# Patient Record
Sex: Female | Born: 1985 | Race: White | Hispanic: No | Marital: Single | Smoking: Current every day smoker
Health system: Southern US, Community
[De-identification: ages and names within clinical notes are randomized; demographics above are authoritative.]

## PROBLEM LIST (undated history)

## (undated) DIAGNOSIS — F319 Bipolar disorder, unspecified: Secondary | ICD-10-CM

## (undated) DIAGNOSIS — IMO0002 Reserved for concepts with insufficient information to code with codable children: Secondary | ICD-10-CM

## (undated) DIAGNOSIS — R51 Headache: Secondary | ICD-10-CM

## (undated) DIAGNOSIS — F32A Depression, unspecified: Secondary | ICD-10-CM

## (undated) DIAGNOSIS — F329 Major depressive disorder, single episode, unspecified: Secondary | ICD-10-CM

## (undated) HISTORY — PX: COLPOSCOPY W/ BIOPSY / CURETTAGE: SUR283

## (undated) HISTORY — PX: OTHER SURGICAL HISTORY: SHX169

## (undated) HISTORY — PX: GALLBLADDER SURGERY: SHX652

## (undated) HISTORY — PX: TONSILLECTOMY: SUR1361

## (undated) HISTORY — PX: ADENOIDECTOMY: SUR15

## (undated) HISTORY — PX: BREAST SURGERY: SHX581

---
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
## 2007-05-12 ENCOUNTER — Inpatient Hospital Stay (HOSPITAL_COMMUNITY): Admission: AD | Admit: 2007-05-12 | Discharge: 2007-05-12 | Payer: Self-pay | Admitting: Obstetrics & Gynecology

## 2007-09-14 ENCOUNTER — Inpatient Hospital Stay (HOSPITAL_COMMUNITY): Admission: AD | Admit: 2007-09-14 | Discharge: 2007-09-15 | Payer: Self-pay | Admitting: Obstetrics and Gynecology

## 2007-09-17 ENCOUNTER — Inpatient Hospital Stay (HOSPITAL_COMMUNITY): Admission: AD | Admit: 2007-09-17 | Discharge: 2007-09-17 | Payer: Self-pay | Admitting: Obstetrics and Gynecology

## 2007-09-18 ENCOUNTER — Inpatient Hospital Stay (HOSPITAL_COMMUNITY): Admission: AD | Admit: 2007-09-18 | Discharge: 2007-09-18 | Payer: Self-pay | Admitting: Obstetrics and Gynecology

## 2007-09-19 ENCOUNTER — Inpatient Hospital Stay (HOSPITAL_COMMUNITY): Admission: AD | Admit: 2007-09-19 | Discharge: 2007-09-21 | Payer: Self-pay | Admitting: Obstetrics and Gynecology
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
## 2010-11-18 NOTE — Op Note (Signed)
[name], [name]                          ACCOUNT NO.:  [id number]   MEDICAL RECORD NO.:  [id number]                   PATIENT TYPE:  AMB   LOCATION:  DAY                                  FACILITY:  [facility]   PHYSICIAN:  Abigail Miyamoto, M.D.              DATE OF BIRTH:  [birth year]   DATE OF PROCEDURE:  07/02/2003  DATE OF DISCHARGE:                                 OPERATIVE REPORT   PREOPERATIVE DIAGNOSIS:  Symptomatic cholelithiasis.   POSTOPERATIVE DIAGNOSIS:  Symptomatic cholelithiasis.   PROCEDURE:  Laparoscopic cholecystectomy with intraoperative cholangiogram.   SURGEON:  Abigail Miyamoto, M.D.   ANESTHESIA:  General endotracheal anesthesia, 0.25% Marcaine.   ESTIMATED BLOOD LOSS:  Minimal.   FINDINGS:  The patient was found to have a thickened gallbladder wall and  multiple gallstones in the gallbladder.  Cholangiogram was normal.   PROCEDURE IN DETAIL:  The patient was brought to the operating room and  identified as [employer].  She was placed supine on the operating table,  and general anesthesia was induced.  Her abdomen was then prepped and draped  in the usual sterile fashion.  Using a #15 blade, a small transverse  incision was made below the umbilicus.  The incision was carried down  through the fascia, which was then opened with a scalpel.  The hemostat was  then used to pass into the peritoneal cavity.  Next, a 0 running pursestring  suture was placed around the fascial opening.  The Hasson cannula was placed  through the opening.  Insufflation of the abdomen was begun.  Next, a 12 mm  port was placed in the patient's epigastrium.  Two 5 mm ports were placed  through the patient's right flank under direct vision.  The gallbladder was  then grasped and retracted above the liver bed.  Dissection was then carried  out at the base of the gallbladder.  The cystic duct was dissected down and  clipped once distally.  It was then partly opened with  laparoscopic  scissors.  An angiocatheter was then inserted in the right upper quadrant  under direct vision.  The cholangiocath was passed through this and placed  into the opening in the cystic duct.  A cholangiogram was then performed  with contrast under direct fluoroscopy.  Contrast was seen to flow into the  entire biliary system and duodenum without evidence of abnormality or  obstructing stone.  The cholangiocath was then completely removed.  The  cystic duct was then clipped three times proximally and transected with  scissors.  The cystic artery and the posterior branch were identified and  clipped, twice proximally and once distally and transected with scissors as  well.  The gallbladder was then slowly dissected free on the liver bed with  the electrocautery.  The gallbladder was opened slightly, and several stones  spilled out.  These were grasped with the stone grasper.  Once  the  gallbladder was removed from the liver bed, it was placed in an endo sac and  removed through the incision at the umbilicus.  A 0 Vicryl was tied at the  umbilicus posterior the fascial defect.  The abdomen was then copiously  irrigated with normal saline.  All free stones were easily scooped up and  suctioned out.  Hemostasis was felt to be achieved.  All ports were removed  under direct vision, and the abdomen was deflated.  All incisions were then  anesthetized with 0.25%  Marcaine and closed with 4-0 Monocryl subcuticular sutures.  Steri-Strips,  gauze, and tape were then applied.  The patient tolerated the procedure  well.  All sponge, needle, and instrument counts were correct at the end of  the procedure.  The patient was then extubated in the operating room and  returned to the recovery room.                                               Abigail Miyamoto, M.D.    DB/MEDQ  D:  07/02/2003  T:  07/02/2003  Job:  [id number]

## 2010-11-18 NOTE — Discharge Summary (Signed)
[facility]  Patient:    [name], [name] Visit Number: [id number] MRN: [id number]          Service Type: PSY Location: 100 0101 01 Attending Physician:  Veneta Penton. Dictated by:   Veneta Penton, M.D. Admit Date:  01/21/2002 Discharge Date: 01/23/2002                             Discharge Summary  REASON FOR ADMISSION:  This 25 year old [name] female was admitted complaining of depression with suicidal ideation with a plan she refused to discuss.  For further history of present illness, please see the patients psychiatric admission assessment.  PHYSICAL EXAMINATION:  At the time of admission was significant for obesity, a history of migraine headaches, and dysmenorrhea.  She had small cyst on her right earlobe.  She had an otherwise unremarkable physical examination.  LABORATORY EXAMINATION:  The patient underwent a laboratory work-up to rule out any other medical problems contributing to her symptomatology.  A urine probe for gonorrhea and chlamydia were negative.  TSH and free T4 were within normal limits.  A GGT was within normal limits.  Hepatic panel was within normal limits.  Basic metabolic panel was within normal limits.  A CBC showed a Broerman count of 4.7 thousand, MCHC of 34.6 and was otherwise unremarkable.  A urine drug screen was negative.  Urine pregnancy test was negative.  An RPR was nonreactive.  The patient received no x-rays, no special procedures, no additional consultations.  She sustained no complications during the course of this hospitalization.  HOSPITAL COURSE:  On admission, the patient was psychomotor agitated, showed poor impulse control, decreased concentration.  Her affect and mood were depressed, irritable, and anxious.  She was begun on a trial of Effexor XR and has tolerated this medication well without side effects.  She has been actively participating in all aspects of the therapeutic treatment program  and over the course of this hospitalization has denied any homicidal or suicidal ideation.  Her affect and mood have improved.  She no longer appears to be a danger to herself or others and consequently is felt to have reached her maximum benefits of hospitalization and is ready for discharge to a less restricted alternative setting.  CONDITION ON DISCHARGE:  Improved  FINAL DIAGNOSIS: Axis I:    1. Major depression, single episode, severe, without psychosis.            2. Oppositional-defiant disorder.            3. Rule out conduct disorder.            4. Alcohol abuse.            5. Cannabis abuse.            6. Rule out polysubstance abuse or dependence. Axis II:   1. Rule out personality disorder not otherwise specified.            2. Rule out learning disorder not otherwise specified. Axis III:  Dysmenorrhea, migraine headaches, obesity. Axis IV:   Current psychosocial stressors are severe. Axis V:    Code 20 on admission, code 30 on discharge.  FURTHER EVALUATION AND TREATMENT RECOMMENDATIONS: 1. The patient is discharged to home. 2. She is discharged on an unrestricted level of activity and a regular diet. 3. She will follow up with Dr. Len Blalock, her outpatient psychiatrist, for    all further aspects  of her psychiatric care and consequently I will sign    off on the case at this time.  She will follow up with her her primary care    physician for all further aspects of her medical care.  DISCHARGE MEDICATIONS:  Effexor XR 37.5 mg p.o. q.a.m. with food for 5 days, then increasing to 75 mg p.o. q.a.m. with food. Dictated by:   Veneta Penton, M.D. Attending Physician:  Veneta Penton DD:  01/23/02 TD:  01/25/02 Job: [id number]

## 2011-03-27 LAB — URINALYSIS, ROUTINE W REFLEX MICROSCOPIC
Bilirubin Urine: NEGATIVE
Glucose, UA: NEGATIVE
Ketones, ur: 15 — AB
Leukocytes, UA: NEGATIVE
Nitrite: NEGATIVE
Protein, ur: NEGATIVE
Specific Gravity, Urine: 1.025
Urobilinogen, UA: 0.2
pH: 6.5

## 2011-03-27 LAB — CCBB MATERNAL DONOR DRAW

## 2011-03-27 LAB — URIC ACID: Uric Acid, Serum: 6

## 2011-03-27 LAB — CBC
HCT: 36.2
HCT: 37.1
Hemoglobin: 10.9 — ABNORMAL LOW
Hemoglobin: 12.5
Hemoglobin: 13
MCHC: 34.5
MCHC: 34.6
MCV: 85.8
MCV: 86.4
Platelets: 190
Platelets: 210
RBC: 3.65 — ABNORMAL LOW
RDW: 12.7
RDW: 13
WBC: 16.2 — ABNORMAL HIGH
WBC: 18.1 — ABNORMAL HIGH

## 2011-03-27 LAB — URINE MICROSCOPIC-ADD ON

## 2011-03-27 LAB — COMPREHENSIVE METABOLIC PANEL
Albumin: 2.7 — ABNORMAL LOW
BUN: 6
Creatinine, Ser: 0.54
Glucose, Bld: 81
Total Protein: 6.2

## 2011-03-27 LAB — LACTATE DEHYDROGENASE: LDH: 145

## 2011-04-11 LAB — URINALYSIS, ROUTINE W REFLEX MICROSCOPIC
Glucose, UA: NEGATIVE
Ketones, ur: NEGATIVE
Nitrite: NEGATIVE
Specific Gravity, Urine: 1.015
pH: 7

## 2012-07-22 ENCOUNTER — Inpatient Hospital Stay (HOSPITAL_COMMUNITY): Payer: 59

## 2012-07-22 ENCOUNTER — Inpatient Hospital Stay (HOSPITAL_COMMUNITY)
Admission: AD | Admit: 2012-07-22 | Discharge: 2012-07-22 | Disposition: A | Discharge status: Hospice | Payer: 59 | Source: Ambulatory Visit | Attending: Obstetrics & Gynecology | Admitting: Obstetrics & Gynecology

## 2012-07-22 ENCOUNTER — Encounter (HOSPITAL_COMMUNITY): Payer: Self-pay | Admitting: *Deleted

## 2012-07-22 DIAGNOSIS — N730 Acute parametritis and pelvic cellulitis: Secondary | ICD-10-CM | POA: Insufficient documentation

## 2012-07-22 DIAGNOSIS — N949 Unspecified condition associated with female genital organs and menstrual cycle: Secondary | ICD-10-CM | POA: Insufficient documentation

## 2012-07-22 DIAGNOSIS — Z30432 Encounter for removal of intrauterine contraceptive device: Secondary | ICD-10-CM

## 2012-07-22 HISTORY — DX: Headache: R51

## 2012-07-22 HISTORY — DX: Bipolar disorder, unspecified: F31.9

## 2012-07-22 HISTORY — DX: Depression, unspecified: F32.A

## 2012-07-22 HISTORY — DX: Reserved for concepts with insufficient information to code with codable children: IMO0002

## 2012-07-22 HISTORY — DX: Major depressive disorder, single episode, unspecified: F32.9

## 2012-07-22 LAB — CBC
HCT: 38.1 % (ref 36.0–46.0)
Hemoglobin: 12.9 g/dL (ref 12.0–15.0)
MCH: 29.6 pg (ref 26.0–34.0)
MCHC: 33.9 g/dL (ref 30.0–36.0)
MCV: 87.4 fL (ref 78.0–100.0)
Platelets: 162 K/uL (ref 150–400)
RBC: 4.36 MIL/uL (ref 3.87–5.11)
RDW: 12.1 % (ref 11.5–15.5)
WBC: 7.2 K/uL (ref 4.0–10.5)

## 2012-07-22 LAB — POCT PREGNANCY, URINE: Preg Test, Ur: NEGATIVE

## 2012-07-22 MED ORDER — OXYCODONE-ACETAMINOPHEN 5-325 MG PO TABS
2.0000 | ORAL_TABLET | ORAL | Status: AC
  Administered 2012-07-22: 2 via ORAL

## 2012-07-22 MED ORDER — CEFTRIAXONE SODIUM 250 MG IJ SOLR
250.0000 mg | INTRAMUSCULAR | Status: AC
  Administered 2012-07-22: 250 mg via INTRAMUSCULAR

## 2012-07-22 MED ORDER — CEFTRIAXONE SODIUM 250 MG IJ SOLR
INTRAMUSCULAR | Status: AC
  Administered 2012-07-22: 250 mg via INTRAMUSCULAR
  Filled 2012-07-22: qty 250

## 2012-07-22 MED ORDER — OXYCODONE-ACETAMINOPHEN 5-325 MG PO TABS
ORAL_TABLET | ORAL | Status: AC
Start: 2012-07-22 — End: 2012-07-22
  Administered 2012-07-22: 2 via ORAL
  Filled 2012-07-22: qty 2

## 2012-07-22 MED ORDER — OXYCODONE-ACETAMINOPHEN 5-325 MG PO TABS: 2.0000 | ORAL_TABLET | ORAL | Status: DC | PRN

## 2012-07-22 MED ORDER — NORGESTIMATE-ETH ESTRADIOL 0.25-35 MG-MCG PO TABS: 1.0000 | ORAL_TABLET | Freq: Every day | ORAL | Status: AC

## 2012-07-22 MED ORDER — DOXYCYCLINE HYCLATE 100 MG PO CAPS: 100.0000 mg | ORAL_CAPSULE | Freq: Two times a day (BID) | ORAL | Status: DC

## 2012-07-22 MED ORDER — HYDROMORPHONE HCL PF 1 MG/ML IJ SOLN
1.0000 mg | INTRAMUSCULAR | Status: AC
  Administered 2012-07-22: 1 mg via INTRAVENOUS

## 2012-07-22 MED ORDER — HYDROMORPHONE HCL PF 1 MG/ML IJ SOLN
INTRAMUSCULAR | Status: AC
  Filled 2012-07-22: qty 1

## 2012-07-22 NOTE — MAU Provider Note (Signed)
Attestation of Attending Supervision of Advanced Practitioner (CNM/NP): Evaluation and management procedures were performed by the Advanced Practitioner under my supervision and collaboration.  I have reviewed the Advanced Practitioner's note and chart, and I agree with the management and plan.  HARRAWAY-SMITH, Khaliq Turay 10:36 PM     

## 2012-07-22 NOTE — MAU Note (Signed)
Had a cycle for a week at christmas, which she bled for an enitre week.  Started spoting 10 days ago and now she is bleeding moderatly with plum sized blood clots and c/o severe pain in her low abd for the past four days which she says is worsening daily.  Has had mirena in place for two years.
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
## 2012-07-23 LAB — GC/CHLAMYDIA PROBE AMP
CT Probe RNA: POSITIVE — AB
GC Probe RNA: NEGATIVE

## 2012-07-25 ENCOUNTER — Other Ambulatory Visit: Payer: Self-pay | Admitting: Advanced Practice Midwife

## 2012-07-25 MED ORDER — FLUCONAZOLE 150 MG PO TABS: 150.0000 mg | ORAL_TABLET | Freq: Once | ORAL | Status: DC

## 2012-07-25 NOTE — Progress Notes (Signed)
Pt concerned that doxycycline will cause yeast infection.  Diflucan 150 mg with 1 refill sent to pt pharmacy.

## 2014-05-04 ENCOUNTER — Encounter (HOSPITAL_COMMUNITY): Payer: Self-pay | Admitting: *Deleted

## 2014-07-03 HISTORY — PX: BREAST EXCISIONAL BIOPSY: SUR124

## 2015-06-14 ENCOUNTER — Emergency Department (HOSPITAL_COMMUNITY)
Admission: EM | Admit: 2015-06-14 | Discharge: 2015-06-14 | Disposition: A | Discharge status: Home / Self Care | Payer: Self-pay | Attending: Emergency Medicine | Admitting: Emergency Medicine

## 2015-06-14 ENCOUNTER — Emergency Department (HOSPITAL_COMMUNITY): Payer: Self-pay

## 2015-06-14 ENCOUNTER — Encounter (HOSPITAL_COMMUNITY): Payer: Self-pay | Admitting: Emergency Medicine

## 2015-06-14 ENCOUNTER — Emergency Department: Payer: Self-pay | Attending: Emergency Medicine

## 2015-06-14 DIAGNOSIS — R112 Nausea with vomiting, unspecified: Secondary | ICD-10-CM | POA: Insufficient documentation

## 2015-06-14 DIAGNOSIS — Z8659 Personal history of other mental and behavioral disorders: Secondary | ICD-10-CM | POA: Insufficient documentation

## 2015-06-14 DIAGNOSIS — R197 Diarrhea, unspecified: Secondary | ICD-10-CM | POA: Insufficient documentation

## 2015-06-14 DIAGNOSIS — Z792 Long term (current) use of antibiotics: Secondary | ICD-10-CM | POA: Insufficient documentation

## 2015-06-14 DIAGNOSIS — Z793 Long term (current) use of hormonal contraceptives: Secondary | ICD-10-CM | POA: Insufficient documentation

## 2015-06-14 DIAGNOSIS — F17210 Nicotine dependence, cigarettes, uncomplicated: Secondary | ICD-10-CM | POA: Insufficient documentation

## 2015-06-14 DIAGNOSIS — R509 Fever, unspecified: Secondary | ICD-10-CM | POA: Insufficient documentation

## 2015-06-14 DIAGNOSIS — N611 Abscess of the breast and nipple: Secondary | ICD-10-CM | POA: Insufficient documentation

## 2015-06-14 LAB — BASIC METABOLIC PANEL
ANION GAP: 7 (ref 5–15)
BUN: 9 mg/dL (ref 6–20)
CALCIUM: 8.8 mg/dL — AB (ref 8.9–10.3)
CO2: 27 mmol/L (ref 22–32)
Chloride: 104 mmol/L (ref 101–111)
Creatinine, Ser: 0.71 mg/dL (ref 0.44–1.00)
GFR calc Af Amer: >60 mL/min (ref >60)
Glucose, Bld: 90 mg/dL (ref 65–99)
POTASSIUM: 3.8 mmol/L (ref 3.5–5.1)
Sodium: 138 mmol/L (ref 135–145)

## 2015-06-14 LAB — CBC WITH DIFFERENTIAL/PLATELET
BASOS ABS: 0 10*3/uL (ref 0.0–0.1)
Basophils Relative: 0 %
Eosinophils Absolute: 0.1 10*3/uL (ref 0.0–0.7)
Eosinophils Relative: 1 %
HEMATOCRIT: 41.6 % (ref 36.0–46.0)
Hemoglobin: 13.9 g/dL (ref 12.0–15.0)
LYMPHS PCT: 10 %
Lymphs Abs: 1.1 10*3/uL (ref 0.7–4.0)
MCH: 29.9 pg (ref 26.0–34.0)
MCHC: 33.4 g/dL (ref 30.0–36.0)
MCV: 89.5 fL (ref 78.0–100.0)
MONO ABS: 1 10*3/uL (ref 0.1–1.0)
Monocytes Relative: 10 %
NEUTROS ABS: 8.1 10*3/uL — AB (ref 1.7–7.7)
Neutrophils Relative %: 79 %
Platelets: 203 10*3/uL (ref 150–400)
RBC: 4.65 MIL/uL (ref 3.87–5.11)
RDW: 12.2 % (ref 11.5–15.5)
WBC: 10.3 10*3/uL (ref 4.0–10.5)

## 2015-06-14 LAB — POC URINE PREG, ED: PREG TEST UR: NEGATIVE

## 2015-06-14 MED ORDER — ONDANSETRON HCL 4 MG/2ML IJ SOLN
4.0000 mg | Freq: Once | INTRAMUSCULAR | Status: AC
  Administered 2015-06-14: 4 mg via INTRAVENOUS
  Filled 2015-06-14: qty 2

## 2015-06-14 MED ORDER — DIPHENHYDRAMINE HCL 50 MG/ML IJ SOLN
25.0000 mg | Freq: Once | INTRAMUSCULAR | Status: AC
  Administered 2015-06-14: 25 mg via INTRAVENOUS
  Filled 2015-06-14: qty 1

## 2015-06-14 MED ORDER — MORPHINE SULFATE (PF) 2 MG/ML IV SOLN
2.0000 mg | Freq: Once | INTRAVENOUS | Status: AC
  Administered 2015-06-14: 2 mg via INTRAVENOUS
  Filled 2015-06-14: qty 1

## 2015-06-14 MED ORDER — MORPHINE SULFATE (PF) 4 MG/ML IV SOLN
4.0000 mg | Freq: Once | INTRAVENOUS | Status: AC
  Administered 2015-06-14: 4 mg via INTRAVENOUS
  Filled 2015-06-14: qty 1

## 2015-06-14 MED ORDER — HYDROCODONE-ACETAMINOPHEN 5-325 MG PO TABS: ORAL_TABLET | ORAL | Status: DC

## 2015-06-14 MED ORDER — SULFAMETHOXAZOLE-TRIMETHOPRIM 800-160 MG PO TABS: 1.0000 | ORAL_TABLET | Freq: Two times a day (BID) | ORAL | Status: AC

## 2015-06-14 MED ORDER — VANCOMYCIN HCL IN DEXTROSE 1-5 GM/200ML-% IV SOLN
1000.0000 mg | Freq: Once | INTRAVENOUS | Status: AC
  Administered 2015-06-14: 1000 mg via INTRAVENOUS
  Filled 2015-06-14: qty 200

## 2015-06-14 NOTE — ED Provider Notes (Signed)
CSN: [id number]     Arrival date & time 06/14/15  1437 History  By signing my name below, I, [name], attest that this documentation has been prepared under the direction and in the presence of Shirley Lopezperez, PA-C. Electronically Signed: Placido SouLogan King, ED Scribe. 06/14/2015. 3:20 PM.   Chief Complaint  Patient presents with  . Breast Problem    left   The history is provided by the patient. No language interpreter was used.    HPI Comments: [name] is a 29 y.o. female with a hx of boils who presents to the Emergency Department complaining of a point of swelling and irritation to her left central breast with onset 1 week ago. Pt notes her nipples were pierced in 11/2014 which she removed the left nipple piercing 3 days ago due to the swelling. She notes associated, moderate, pain to the affected region, intermittent fevers (97.7 F in triage & TMAX 102 last night), intermittent n/v and diarrhea. She states the redness and pain have been increasing.  Pain is worse with movement of the breast.  Pt denies any trauma, pregnancy or current breastfeeding. She denies a hx of similar symptoms with her breast. She denies any drainage or decreased appetite.   Past Medical History  Diagnosis Date  . Headache(784.0)   . Abnormal Pap smear   . Depression   . Bipolar 1 disorder [facility](HCC)    Past Surgical History  Procedure Laterality Date  . Colposcopy w/ biopsy / curettage    . Tonsillectomy    . Adenoidectomy    . Gallbladder surgery    . Bladder histogram     Family History  Problem Relation Age of Onset  . Hypothyroidism Mother   . Heart disease Father   . COPD Father   . Hypothyroidism Maternal Aunt   . Hypothyroidism Maternal Uncle   . Hypothyroidism Paternal Uncle   . Hypothyroidism Maternal Grandmother   . Heart disease Maternal Grandfather    Social History  Substance Use Topics  . Smoking status: Current Every Day Smoker -- 0.50 packs/day for 10 years    Types:  Cigarettes  . Smokeless tobacco: None  . Alcohol Use: No   OB History    Gravida Para Term Preterm AB TAB SAB Ectopic Multiple Living   2 2 2       2      Review of Systems  Constitutional: Positive for fever and chills. Negative for appetite change.  Respiratory: Negative for shortness of breath.   Gastrointestinal: Positive for nausea, vomiting and diarrhea.  Genitourinary: Negative for dysuria.       Left breast pain, swelling and redness  Musculoskeletal: Positive for myalgias.  Neurological: Negative for weakness.   Allergies  Review of patient's allergies indicates no known allergies.  Home Medications   Prior to Admission medications   Medication Sig Start Date End Date Taking? Authorizing Provider  doxycycline (VIBRAMYCIN) 100 MG capsule Take 1 capsule (100 mg total) by mouth 2 (two) times daily. 07/22/12   Lisa A Leftwich-Kirby, CNM  fluconazole (DIFLUCAN) 150 MG tablet Take 1 tablet (150 mg total) by mouth once. Take second tablet as needed 1 week or more after first tablet. 07/25/12   Lisa A Leftwich-Kirby, CNM  ibuprofen (ADVIL,MOTRIN) 200 MG tablet Take 800 mg by mouth every 6 (six) hours as needed. For stomach pain    Historical Provider, MD  norgestimate-ethinyl estradiol (ORTHO-CYCLEN,SPRINTEC,PREVIFEM) 0.25-35 MG-MCG tablet Take 1 tablet by mouth daily. 07/22/12   Misty StanleyLisa  A Leftwich-Kirby, CNM  oxyCODONE-acetaminophen (PERCOCET/ROXICET) 5-325 MG per tablet Take 2 tablets by mouth every 4 (four) hours as needed for pain. 07/22/12   Lisa A Leftwich-Kirby, CNM   BP 142/66 mmHg  Pulse 116  Temp(Src) 97.7 F (36.5 C) (Oral)  Resp 14  Ht  (1.6 m)  Wt 186 lb (84.369 kg)  BMI 32.96 kg/m2  SpO2 100%  LMP 05/31/2015 Physical Exam  Constitutional: She is oriented to person, place, and time. She appears well-developed and well-nourished.  HENT:  Head: Normocephalic and atraumatic.  Mouth/Throat: Oropharynx is clear and moist. No oropharyngeal exudate.  Neck: Normal  range of motion. No tracheal deviation present.  Cardiovascular: Normal rate.   Pulmonary/Chest: Effort normal. No respiratory distress.  Abdominal: Soft. There is no tenderness.  Genitourinary: There is breast tenderness. No breast discharge.  Exam chaperoned by nursing staff Moderate erythema of the medial left breast with induration of the medial areola; no fluctuance; no nipple discharge  Musculoskeletal: Normal range of motion.  Lymphadenopathy:    She has no cervical adenopathy.       Left axillary: No pectoral and no lateral adenopathy present. Neurological: She is alert and oriented to person, place, and time.  Skin: Skin is warm and dry. She is not diaphoretic.  Psychiatric: She has a normal mood and affect. Her behavior is normal.  Nursing note and vitals reviewed.  ED Course  Procedures  DIAGNOSTIC STUDIES: Oxygen Saturation is 100% on RA, normal by my interpretation.    COORDINATION OF CARE: 3:19 PM Pt presents today due to left breast swelling. Discussed next steps with pt including a breast examination next steps based on results.     Labs Review Labs Reviewed  CBC WITH DIFFERENTIAL/PLATELET  BASIC METABOLIC PANEL    Imaging Review US Breast Ltd Uni Left Inc Axilla  06/14/2015  CLINICAL DATA:  LEFT breast pain for 3 days, redness, question breast abscess ; removed a nipple piercing 3 days ago EXAM: EMERGENT ULTRASOUND OF THE LEFT BREAST COMPARISON:  None FINDINGS: Targeted ultrasound is performed at the area of clinical concern in the LEFT breast. At this site, a complex hypoechoic collection is identified measuring 4.5 x 2.3 x 3.6 cm. Lesion is located retro areolar and corresponds to an area of visible redness and tenderness extending medially. Color Doppler imaging demonstrates no blood flow within the collection but does demonstrate slightly increased blood flow at the periphery surrounding the collection. Findings are suspicious for inflammatory process such as  breast abscess. IMPRESSION: 4.5 x 2.3 x 3.6 cm diameter complex fluid collection in the subcutaneous tissues suspicious for breast abscess. RECOMMENDATION: Clinical management of the site recommended. This collection could be aspirated under ultrasound guidance if clinically indicated. This exam was performed emergently and does not constitute an adequate assessment to exclude a breast malignancy. Close clinical followup including serial breast physical exam recommended with consideration of follow-up mammography and ultrasound as indicated. Findings discussed with Zyier Dykema at time of interpretation, 1730 hours on 06/14/2015. BI-RADS CATEGORY  0: Incomplete. Need additional imaging evaluation and/or prior mammograms for comparison. Electronically Signed   By: Ulyses Southward M.D.   On: 06/14/2015 17:34    I have personally reviewed and evaluated these lab results as part of my medical decision-making.    MDM   Final diagnoses:  Breast abscess   Pt is well appearing, non-toxic.  Vitals stable.  Localized abscess to the left breast.  I have discussed importance of close surgical f/u for  possible I&D.  Discussed [place] finding.  Given IV vancomycin, Rx for bactim and vicodin for pain.  Strict return precautions given.    I personally performed the services described in this documentation, which was scribed in my presence. The recorded information has been reviewed and is accurate.    Rosey Bath 06/15/15 2157  Vanetta Mulders, MD 06/17/15 [phone]

## 2015-06-14 NOTE — ED Notes (Signed)
Having redness and swelling to left breast.  Pt removed nipple ring about 3 days ago.  Pierced in May 2016.  Rates pain 7/10.  Breast is red and swollen.

## 2015-06-14 NOTE — ED Notes (Signed)
EDPa in to assess pt.

## 2015-06-17 ENCOUNTER — Encounter (HOSPITAL_COMMUNITY): Payer: Self-pay

## 2015-06-17 ENCOUNTER — Inpatient Hospital Stay (HOSPITAL_COMMUNITY)
Admission: EM | Admit: 2015-06-17 | Discharge: 2015-06-19 | DRG: 580 | Disposition: A | Discharge status: Home / Self Care | Payer: Self-pay | Attending: Surgery | Admitting: Surgery

## 2015-06-17 ENCOUNTER — Emergency Department (HOSPITAL_COMMUNITY): Payer: Self-pay | Admitting: Certified Registered Nurse Anesthetist

## 2015-06-17 ENCOUNTER — Encounter (HOSPITAL_COMMUNITY): Admission: EM | Disposition: A | Discharge status: Home / Self Care | Payer: Self-pay | Source: Home / Self Care

## 2015-06-17 DIAGNOSIS — Z79899 Other long term (current) drug therapy: Secondary | ICD-10-CM

## 2015-06-17 DIAGNOSIS — F319 Bipolar disorder, unspecified: Secondary | ICD-10-CM | POA: Diagnosis present

## 2015-06-17 DIAGNOSIS — N611 Abscess of the breast and nipple: Principal | ICD-10-CM | POA: Diagnosis present

## 2015-06-17 DIAGNOSIS — E669 Obesity, unspecified: Secondary | ICD-10-CM | POA: Diagnosis present

## 2015-06-17 DIAGNOSIS — Z6832 Body mass index (BMI) 32.0-32.9, adult: Secondary | ICD-10-CM

## 2015-06-17 DIAGNOSIS — L0291 Cutaneous abscess, unspecified: Secondary | ICD-10-CM

## 2015-06-17 DIAGNOSIS — F17210 Nicotine dependence, cigarettes, uncomplicated: Secondary | ICD-10-CM | POA: Diagnosis present

## 2015-06-17 DIAGNOSIS — L0390 Cellulitis, unspecified: Secondary | ICD-10-CM | POA: Diagnosis present

## 2015-06-17 DIAGNOSIS — Z8249 Family history of ischemic heart disease and other diseases of the circulatory system: Secondary | ICD-10-CM

## 2015-06-17 DIAGNOSIS — Z803 Family history of malignant neoplasm of breast: Secondary | ICD-10-CM

## 2015-06-17 DIAGNOSIS — Z825 Family history of asthma and other chronic lower respiratory diseases: Secondary | ICD-10-CM

## 2015-06-17 HISTORY — PX: IRRIGATION AND DEBRIDEMENT ABSCESS: SHX5252

## 2015-06-17 LAB — BASIC METABOLIC PANEL
ANION GAP: 10 (ref 5–15)
BUN: 8 mg/dL (ref 6–20)
CALCIUM: 8.8 mg/dL — AB (ref 8.9–10.3)
CO2: 23 mmol/L (ref 22–32)
CREATININE: 0.67 mg/dL (ref 0.44–1.00)
Chloride: 101 mmol/L (ref 101–111)
GFR calc Af Amer: >60 mL/min (ref >60)
GLUCOSE: 77 mg/dL (ref 65–99)
Potassium: 4.2 mmol/L (ref 3.5–5.1)
Sodium: 134 mmol/L — ABNORMAL LOW (ref 135–145)

## 2015-06-17 LAB — CBC WITH DIFFERENTIAL/PLATELET
BASOS ABS: 0 10*3/uL (ref 0.0–0.1)
BASOS PCT: 0 %
EOS ABS: 0 10*3/uL (ref 0.0–0.7)
EOS PCT: 0 %
HCT: 39 % (ref 36.0–46.0)
Hemoglobin: 12.9 g/dL (ref 12.0–15.0)
Lymphocytes Relative: 9 %
Lymphs Abs: 1.1 10*3/uL (ref 0.7–4.0)
MCH: 29.7 pg (ref 26.0–34.0)
MCHC: 33.1 g/dL (ref 30.0–36.0)
MCV: 89.9 fL (ref 78.0–100.0)
MONO ABS: 1.2 10*3/uL — AB (ref 0.1–1.0)
MONOS PCT: 9 %
NEUTROS ABS: 10 10*3/uL — AB (ref 1.7–7.7)
Neutrophils Relative %: 82 %
PLATELETS: 210 10*3/uL (ref 150–400)
RBC: 4.34 MIL/uL (ref 3.87–5.11)
RDW: 12.1 % (ref 11.5–15.5)
WBC: 12.3 10*3/uL — ABNORMAL HIGH (ref 4.0–10.5)

## 2015-06-17 SURGERY — IRRIGATION AND DEBRIDEMENT ABSCESS
Anesthesia: General | Laterality: Left

## 2015-06-17 MED ORDER — DIPHENHYDRAMINE HCL 25 MG PO CAPS
25.0000 mg | ORAL_CAPSULE | Freq: Four times a day (QID) | ORAL | Status: DC | PRN
  Administered 2015-06-17: 25 mg via ORAL
  Filled 2015-06-17: qty 1

## 2015-06-17 MED ORDER — OXYCODONE HCL 5 MG/5ML PO SOLN
5.0000 mg | Freq: Once | ORAL | Status: DC | PRN
  Filled 2015-06-17: qty 5

## 2015-06-17 MED ORDER — LACTATED RINGERS IV SOLN
INTRAVENOUS | Status: DC
  Administered 2015-06-17 (×2): via INTRAVENOUS

## 2015-06-17 MED ORDER — PROPOFOL 10 MG/ML IV BOLUS
INTRAVENOUS | Status: AC
  Filled 2015-06-17: qty 20

## 2015-06-17 MED ORDER — ONDANSETRON HCL 4 MG/2ML IJ SOLN
INTRAMUSCULAR | Status: DC | PRN
  Administered 2015-06-17: 4 mg via INTRAVENOUS

## 2015-06-17 MED ORDER — ONDANSETRON 4 MG PO TBDP: 4.0000 mg | ORAL_TABLET | Freq: Four times a day (QID) | ORAL | Status: DC | PRN

## 2015-06-17 MED ORDER — HYDROMORPHONE HCL 1 MG/ML IJ SOLN
1.0000 mg | INTRAMUSCULAR | Status: DC | PRN
  Administered 2015-06-17: 1 mg via INTRAVENOUS
  Filled 2015-06-17: qty 1

## 2015-06-17 MED ORDER — VANCOMYCIN HCL IN DEXTROSE 1-5 GM/200ML-% IV SOLN
1000.0000 mg | Freq: Three times a day (TID) | INTRAVENOUS | Status: DC
  Administered 2015-06-17 – 2015-06-19 (×6): 1000 mg via INTRAVENOUS
  Filled 2015-06-17 (×7): qty 200

## 2015-06-17 MED ORDER — ONDANSETRON HCL 4 MG/2ML IJ SOLN: 4.0000 mg | Freq: Four times a day (QID) | INTRAMUSCULAR | Status: DC | PRN

## 2015-06-17 MED ORDER — HYDROMORPHONE HCL 1 MG/ML IJ SOLN
1.0000 mg | INTRAMUSCULAR | Status: DC | PRN
  Administered 2015-06-17 – 2015-06-18 (×4): 1 mg via INTRAVENOUS
  Filled 2015-06-17 (×4): qty 1

## 2015-06-17 MED ORDER — ONDANSETRON HCL 4 MG/2ML IJ SOLN
4.0000 mg | Freq: Four times a day (QID) | INTRAMUSCULAR | Status: DC | PRN
  Administered 2015-06-17: 4 mg via INTRAVENOUS
  Filled 2015-06-17: qty 2

## 2015-06-17 MED ORDER — OXYCODONE HCL 5 MG PO TABS
5.0000 mg | ORAL_TABLET | ORAL | Status: DC | PRN
  Administered 2015-06-18 – 2015-06-19 (×6): 10 mg via ORAL
  Filled 2015-06-17 (×6): qty 2

## 2015-06-17 MED ORDER — HYDROMORPHONE HCL 1 MG/ML IJ SOLN
INTRAMUSCULAR | Status: AC
  Filled 2015-06-17: qty 1

## 2015-06-17 MED ORDER — ZOLPIDEM TARTRATE 5 MG PO TABS: 5.0000 mg | ORAL_TABLET | Freq: Every evening | ORAL | Status: DC | PRN

## 2015-06-17 MED ORDER — LIDOCAINE HCL (CARDIAC) 20 MG/ML IV SOLN
INTRAVENOUS | Status: AC
  Filled 2015-06-17: qty 5

## 2015-06-17 MED ORDER — MIDAZOLAM HCL 2 MG/2ML IJ SOLN
INTRAMUSCULAR | Status: AC
  Filled 2015-06-17: qty 2

## 2015-06-17 MED ORDER — SODIUM CHLORIDE 0.9 % IV SOLN
3.0000 g | Freq: Four times a day (QID) | INTRAVENOUS | Status: DC
  Administered 2015-06-17 – 2015-06-19 (×8): 3 g via INTRAVENOUS
  Filled 2015-06-17 (×10): qty 3

## 2015-06-17 MED ORDER — DIPHENHYDRAMINE HCL 50 MG/ML IJ SOLN: 25.0000 mg | Freq: Four times a day (QID) | INTRAMUSCULAR | Status: DC | PRN

## 2015-06-17 MED ORDER — OXYCODONE HCL 5 MG PO TABS: 5.0000 mg | ORAL_TABLET | Freq: Once | ORAL | Status: DC | PRN

## 2015-06-17 MED ORDER — OXYCODONE-ACETAMINOPHEN 5-325 MG PO TABS
1.0000 | ORAL_TABLET | Freq: Once | ORAL | Status: AC
  Administered 2015-06-17: 1 via ORAL
  Filled 2015-06-17: qty 1

## 2015-06-17 MED ORDER — HYDROMORPHONE HCL 1 MG/ML IJ SOLN
0.2500 mg | INTRAMUSCULAR | Status: DC | PRN
  Administered 2015-06-17 (×2): 0.5 mg via INTRAVENOUS

## 2015-06-17 MED ORDER — ENOXAPARIN SODIUM 40 MG/0.4ML ~~LOC~~ SOLN
40.0000 mg | SUBCUTANEOUS | Status: DC
  Administered 2015-06-17 – 2015-06-18 (×2): 40 mg via SUBCUTANEOUS
  Filled 2015-06-17 (×3): qty 0.4

## 2015-06-17 MED ORDER — LIDOCAINE HCL (CARDIAC) 20 MG/ML IV SOLN
INTRAVENOUS | Status: DC | PRN
  Administered 2015-06-17: 100 mg via INTRAVENOUS

## 2015-06-17 MED ORDER — LACTATED RINGERS IV SOLN
INTRAVENOUS | Status: DC
  Administered 2015-06-17: 1000 mL via INTRAVENOUS

## 2015-06-17 MED ORDER — IBUPROFEN 600 MG PO TABS
600.0000 mg | ORAL_TABLET | Freq: Four times a day (QID) | ORAL | Status: DC | PRN
  Filled 2015-06-17: qty 1

## 2015-06-17 MED ORDER — VANCOMYCIN HCL IN DEXTROSE 1-5 GM/200ML-% IV SOLN
1000.0000 mg | Freq: Once | INTRAVENOUS | Status: AC
  Administered 2015-06-17: 1000 mg via INTRAVENOUS
  Filled 2015-06-17: qty 200

## 2015-06-17 MED ORDER — ACETAMINOPHEN 650 MG RE SUPP: 650.0000 mg | Freq: Four times a day (QID) | RECTAL | Status: DC | PRN

## 2015-06-17 MED ORDER — PROMETHAZINE HCL 25 MG/ML IJ SOLN: 6.2500 mg | INTRAMUSCULAR | Status: DC | PRN

## 2015-06-17 MED ORDER — PROPOFOL 10 MG/ML IV BOLUS
INTRAVENOUS | Status: DC | PRN
Start: 2015-06-17 — End: 2015-06-17
  Administered 2015-06-17: 200 mg via INTRAVENOUS

## 2015-06-17 MED ORDER — 0.9 % SODIUM CHLORIDE (POUR BTL) OPTIME
TOPICAL | Status: DC | PRN
  Administered 2015-06-17: 1000 mL

## 2015-06-17 MED ORDER — NORGESTIMATE-ETH ESTRADIOL 0.25-35 MG-MCG PO TABS
1.0000 | ORAL_TABLET | Freq: Every day | ORAL | Status: DC
  Administered 2015-06-18 (×2): 1 via ORAL

## 2015-06-17 MED ORDER — ACETAMINOPHEN 325 MG PO TABS: 650.0000 mg | ORAL_TABLET | Freq: Four times a day (QID) | ORAL | Status: DC | PRN

## 2015-06-17 MED ORDER — HYDROCODONE-ACETAMINOPHEN 5-325 MG PO TABS
1.0000 | ORAL_TABLET | ORAL | Status: DC | PRN
  Administered 2015-06-18: 1 via ORAL
  Filled 2015-06-17: qty 1

## 2015-06-17 MED ORDER — FENTANYL CITRATE (PF) 250 MCG/5ML IJ SOLN
INTRAMUSCULAR | Status: AC
  Filled 2015-06-17: qty 5

## 2015-06-17 MED ORDER — PHENYLEPHRINE 40 MCG/ML (10ML) SYRINGE FOR IV PUSH (FOR BLOOD PRESSURE SUPPORT)
PREFILLED_SYRINGE | INTRAVENOUS | Status: AC
  Filled 2015-06-17: qty 10

## 2015-06-17 MED ORDER — FENTANYL CITRATE (PF) 100 MCG/2ML IJ SOLN
INTRAMUSCULAR | Status: DC | PRN
  Administered 2015-06-17 (×4): 50 ug via INTRAVENOUS

## 2015-06-17 MED ORDER — ONDANSETRON HCL 4 MG/2ML IJ SOLN
INTRAMUSCULAR | Status: AC
  Filled 2015-06-17: qty 2

## 2015-06-17 MED ORDER — PHENYLEPHRINE HCL 10 MG/ML IJ SOLN
INTRAMUSCULAR | Status: DC | PRN
  Administered 2015-06-17: 80 ug via INTRAVENOUS

## 2015-06-17 MED ORDER — MIDAZOLAM HCL 5 MG/5ML IJ SOLN
INTRAMUSCULAR | Status: DC | PRN
  Administered 2015-06-17: 2 mg via INTRAVENOUS

## 2015-06-17 MED ORDER — DEXAMETHASONE SODIUM PHOSPHATE 10 MG/ML IJ SOLN
INTRAMUSCULAR | Status: DC | PRN
  Administered 2015-06-17: 10 mg via INTRAVENOUS

## 2015-06-17 SURGICAL SUPPLY — 39 items
APL SKNCLS STERI-STRIP NONHPOA (GAUZE/BANDAGES/DRESSINGS)
BENZOIN TINCTURE PRP APPL 2/3 (GAUZE/BANDAGES/DRESSINGS) IMPLANT
BINDER BREAST XLRG (GAUZE/BANDAGES/DRESSINGS) ×2 IMPLANT
BLADE HEX COATED 2.75 (ELECTRODE) ×1 IMPLANT
BLADE SURG SZ10 CARB STEEL (BLADE) ×1 IMPLANT
CLOSURE WOUND 1/2 X4 (GAUZE/BANDAGES/DRESSINGS)
COVER SURGICAL LIGHT HANDLE (MISCELLANEOUS) ×3 IMPLANT
DECANTER SPIKE VIAL GLASS SM (MISCELLANEOUS) IMPLANT
DRAIN CHANNEL RND F F (WOUND CARE) IMPLANT
DRAPE LAPAROTOMY T 102X78X121 (DRAPES) IMPLANT
DRAPE LAPAROTOMY TRNSV 102X78 (DRAPE) ×2 IMPLANT
DRAPE SHEET LG 3/4 BI-LAMINATE (DRAPES) IMPLANT
ELECT REM PT RETURN 9FT ADLT (ELECTROSURGICAL) ×3
ELECTRODE REM PT RTRN 9FT ADLT (ELECTROSURGICAL) ×1 IMPLANT
EVACUATOR SILICONE 100CC (DRAIN) IMPLANT
GAUZE IODOFORM PACK 1/2 7832 (GAUZE/BANDAGES/DRESSINGS) ×2 IMPLANT
GAUZE SPONGE 4X4 12PLY STRL (GAUZE/BANDAGES/DRESSINGS) ×3 IMPLANT
GLOVE BIOGEL PI IND STRL 7.0 (GLOVE) ×1 IMPLANT
GLOVE BIOGEL PI INDICATOR 7.0 (GLOVE) ×2
GLOVE SURG ORTHO 8.0 STRL STRW (GLOVE) ×3 IMPLANT
GOWN STRL REUS W/TWL LRG LVL3 (GOWN DISPOSABLE) ×3 IMPLANT
GOWN STRL REUS W/TWL XL LVL3 (GOWN DISPOSABLE) ×4 IMPLANT
KIT BASIN OR (CUSTOM PROCEDURE TRAY) ×3 IMPLANT
NDL HYPO 25X1 1.5 SAFETY (NEEDLE) ×1 IMPLANT
NEEDLE HYPO 25X1 1.5 SAFETY (NEEDLE) IMPLANT
NS IRRIG 1000ML POUR BTL (IV SOLUTION) ×3 IMPLANT
PACK GENERAL/GYN (CUSTOM PROCEDURE TRAY) ×3 IMPLANT
PAD ABD 8X10 STRL (GAUZE/BANDAGES/DRESSINGS) ×2 IMPLANT
PEN SKIN MARKING BROAD (MISCELLANEOUS) IMPLANT
SPONGE LAP 18X18 X RAY DECT (DISPOSABLE) IMPLANT
STAPLER VISISTAT 35W (STAPLE) IMPLANT
STRIP CLOSURE SKIN 1/2X4 (GAUZE/BANDAGES/DRESSINGS) IMPLANT
SUT ETHILON 3 0 PS 1 (SUTURE) IMPLANT
SUT MNCRL AB 4-0 PS2 18 (SUTURE) IMPLANT
SUT VIC AB 3-0 SH 18 (SUTURE) IMPLANT
SWAB COLLECTION DEVICE MRSA (MISCELLANEOUS) ×2 IMPLANT
SYR CONTROL 10ML LL (SYRINGE) ×1 IMPLANT
TOWEL OR 17X26 10 PK STRL BLUE (TOWEL DISPOSABLE) ×3 IMPLANT
TUBE ANAEROBIC SPECIMEN COL (MISCELLANEOUS) ×2 IMPLANT

## 2015-06-17 NOTE — Progress Notes (Signed)
ANTIBIOTIC CONSULT NOTE - INITIAL  Pharmacy Consult for Vancomycin Indication: cellulitis, left breast abscess  No Known Allergies  Patient Measurements:   Adjusted Body Weight:   Vital Signs: Temp: 97.5 F (36.4 C) (12/15 1204) Temp Source: Oral (12/15 1204) BP: 102/60 mmHg (12/15 1204) Pulse Rate: 96 (12/15 1204) Intake/Output from previous day:   Intake/Output from this shift:    Labs:  Recent Labs  06/14/15 1530 06/17/15 1049  WBC 10.3 12.3*  HGB 13.9 12.9  PLT 203 210  CREATININE 0.71 0.67   Estimated Creatinine Clearance: 107.8 mL/min (by C-G formula based on Cr of 0.67). No results for input(s): VANCOTROUGH, VANCOPEAK, VANCORANDOM, GENTTROUGH, GENTPEAK, GENTRANDOM, TOBRATROUGH, TOBRAPEAK, TOBRARND, AMIKACINPEAK, AMIKACINTROU, AMIKACIN in the last 72 hours.   Microbiology: No results found for this or any previous visit (from the past 720 hour(s)).  Medical History: Past Medical History  Diagnosis Date  . Headache(784.0)   . Abnormal Pap smear   . Depression   . Bipolar 1 disorder (HCC)      Assessment: [age] yoF presenting with increasing left breast pain, swelling and erythema. [facility] at which time a US showed a 4.5x2.3x3.6cm fluid collection. She was given Rx for bactrim and discharged home.She presents today with worsening symptoms. Denies any drainage, only skin peeling. Vancomycin per pharmacy and Unasyn per MD ordered for left breast abscess.  Going to OR for I&D.  Today, 06/17/2015: - WBC 12.3 - currently afebrile - SCr 0.67, CrCl>100 ml/min  Antimicrobials this admission: 12/15 Unasyn >>  12/15 Vancomcyin >>   Levels/dose changes this admission: -  Goal of Therapy:  Vancomycin trough level 15-20 mcg/ml  Plan:  1.  Vancomycin 1g IV q8h. 2.  F/u SCr, trough, clinical course.  Clance Bollunyon, Demoni Parmar 06/17/2015,1:00 PM

## 2015-06-17 NOTE — Brief Op Note (Signed)
06/17/2015  3:49 PM  PATIENT:  [name]  29 y.o. female  PRE-OPERATIVE DIAGNOSIS:  left breast abcess  POST-OPERATIVE DIAGNOSIS: same  PROCEDURE:  Incision, drainage, and open packing of left breast abscess  SURGEON:  Surgeon(s) and Role:    * Darnell Levelodd Iqra Rotundo, MD - Primary  ANESTHESIA:   general  EBL:  minimal  BLOOD ADMINISTERED:none  DRAINS: none   LOCAL MEDICATIONS USED:  NONE  SPECIMEN:  Source of Specimen:  cultures from abscess fluid  DISPOSITION OF SPECIMEN:  PATHOLOGY  COUNTS:  YES  TOURNIQUET:  * No tourniquets in log *  DICTATION: .Other Dictation: Dictation Number [id number]  PLAN OF CARE: Admit for overnight observation  PATIENT DISPOSITION:  PACU - hemodynamically stable.   Delay start of Pharmacological VTE agent (>24hrs) due to surgical blood loss or risk of bleeding: yes  Velora Hecklerodd M. Tarryn Bogdan, MD, [facility] Office: [phone]

## 2015-06-17 NOTE — Anesthesia Preprocedure Evaluation (Addendum)
Anesthesia Evaluation  Patient identified by MRN, date of birth, ID band Patient awake    Reviewed: Allergy & Precautions, H&P , NPO status , Patient's Chart, lab work & pertinent test results  History of Anesthesia Complications Negative for: history of anesthetic complications  Airway Mallampati: II  TM Distance: >3 FB Neck ROM: full    Dental no notable dental hx. (+) Teeth Intact, Dental Advisory Given   Pulmonary Current Smoker,    Pulmonary exam normal breath sounds clear to auscultation       Cardiovascular negative cardio ROS Normal cardiovascular exam Rhythm:regular Rate:Normal     Neuro/Psych  Headaches, Depression Bipolar Disorder    GI/Hepatic negative GI ROS, Neg liver ROS,   Endo/Other  obesity  Renal/GU negative Renal ROS     Musculoskeletal   Abdominal Normal abdominal exam  (+)   Peds  Hematology negative hematology ROS (+)   Anesthesia Other Findings Left breast abscess  Reproductive/Obstetrics negative OB ROS                            Anesthesia Physical Anesthesia Plan  ASA: II  Anesthesia Plan: General   Post-op Pain Management:    Induction: Intravenous  Airway Management Planned: LMA  Additional Equipment:   Intra-op Plan:   Post-operative Plan: Extubation in OR  Informed Consent: I have reviewed the patients History and Physical, chart, labs and discussed the procedure including the risks, benefits and alternatives for the proposed anesthesia with the patient or authorized representative who has indicated his/her understanding and acceptance.   Dental Advisory Given  Plan Discussed with: Anesthesiologist and CRNA  Anesthesia Plan Comments:         Anesthesia Quick Evaluation

## 2015-06-17 NOTE — Anesthesia Procedure Notes (Signed)
Procedure Name: LMA Insertion Date/Time: 06/17/2015 3:24 PM Performed by: Epimenio SarinJARVELA, [name] Pre-anesthesia Checklist: Patient identified, Emergency Drugs available, Suction available, Patient being monitored and Timeout performed Patient Re-evaluated:Patient Re-evaluated prior to inductionOxygen Delivery Method: Circle system utilized Preoxygenation: Pre-oxygenation with 100% oxygen Intubation Type: IV induction Ventilation: Mask ventilation without difficulty LMA: LMA with gastric port inserted LMA Size: 3.0 Number of attempts: 1 Dental Injury: Teeth and Oropharynx as per pre-operative assessment

## 2015-06-17 NOTE — Anesthesia Postprocedure Evaluation (Signed)
Anesthesia Post Note  Patient: Shirley BuddLeeann N King  Procedure(s) Performed: Procedure(s) (LRB): IRRIGATION AND DEBRIDEMENT ABSCESS LEFT BREAST (Left)  Patient location during evaluation: PACU Anesthesia Type: General Level of consciousness: awake and alert Pain management: pain level controlled Vital Signs Assessment: post-procedure vital signs reviewed and stable Respiratory status: spontaneous breathing, nonlabored ventilation, respiratory function stable and patient connected to nasal cannula oxygen Cardiovascular status: blood pressure returned to baseline and stable Postop Assessment: no signs of nausea or vomiting Anesthetic complications: no    Last Vitals:  Filed Vitals:   06/17/15 1637 06/17/15 1650  BP:  106/60  Pulse: 95 88  Temp:  37.5 C  Resp: 14 16    Last Pain:  Filed Vitals:   06/17/15 1732  PainSc: 1                  [name]

## 2015-06-17 NOTE — ED Provider Notes (Signed)
CSN: [id number]     Arrival date & time 06/17/15  4098 History   First MD Initiated Contact with Patient 06/17/15 1010     Chief Complaint  Patient presents with  . Abscess     (Consider location/radiation/quality/duration/timing/severity/associated sxs/prior Treatment) HPI Comments: Pt comes in with c/o worsening swelling and redness to her left breast. She states that she had her nipple pierced in 11/2014. She took the piercing out about 1 weeks ago because of the infection. Pt was seen 3 days ago at [facility] and was told that she had an abscess and was given iv antibiotics and sent home with hydrocodone and bactrim. Pt is here because the symptoms are worsening with redness to a lot of the breast and pain with any kind of motion. Has a history of abscess in the past.  The history is provided by the patient. No language interpreter was used.    Past Medical History  Diagnosis Date  . Headache(784.0)   . Abnormal Pap smear   . Depression   . Bipolar 1 disorder [facility])    Past Surgical History  Procedure Laterality Date  . Colposcopy w/ biopsy / curettage    . Tonsillectomy    . Adenoidectomy    . Gallbladder surgery    . Bladder histogram     Family History  Problem Relation Age of Onset  . Hypothyroidism Mother   . Heart disease Father   . COPD Father   . Hypothyroidism Maternal Aunt   . Hypothyroidism Maternal Uncle   . Hypothyroidism Paternal Uncle   . Hypothyroidism Maternal Grandmother   . Heart disease Maternal Grandfather    Social History  Substance Use Topics  . Smoking status: Current Every Day Smoker -- 0.50 packs/day for 10 years    Types: Cigarettes  . Smokeless tobacco: None  . Alcohol Use: No   OB History    Gravida Para Term Preterm AB TAB SAB Ectopic Multiple Living   Review of Systems  All other systems reviewed and are negative.     Allergies  Review of patient's allergies indicates no known allergies.  Home  Medications   Prior to Admission medications   Medication Sig Start Date End Date Taking? Authorizing Provider  HYDROcodone-acetaminophen (NORCO/VICODIN) 5-325 MG tablet Take one-two tabs po q 4-6 hrs prn pain 06/14/15   Tammy Triplett, PA-C  ibuprofen (ADVIL,MOTRIN) 200 MG tablet Take 800 mg by mouth every 6 (six) hours as needed. For stomach pain    Historical Provider, MD  norgestimate-ethinyl estradiol (ORTHO-CYCLEN,SPRINTEC,PREVIFEM) 0.25-35 MG-MCG tablet Take 1 tablet by mouth daily. 07/22/12   Lisa A Leftwich-Kirby, CNM  sulfamethoxazole-trimethoprim (BACTRIM DS,SEPTRA DS) 800-160 MG tablet Take 1 tablet by mouth 2 (two) times daily. For 10 days 06/14/15 06/21/15  Tammy Triplett, PA-C   BP 123/70 mmHg  Pulse 99  Temp(Src) 97.5 F (36.4 C)  Resp 16  SpO2 100%  LMP 05/31/2015 Physical Exam  Constitutional: She is oriented to person, place, and time. She appears well-developed and well-nourished.  Cardiovascular: Normal rate and regular rhythm.   Pulmonary/Chest: Effort normal and breath sounds normal.  Musculoskeletal: Normal range of motion.  Neurological: She is alert and oriented to person, place, and time.  Skin:  Redness and warmth noted to the medial aspect of breast as swell as below the nipple. Pt has a fluctuant area to the medial aspect of the breast just to the  side of the nipple. No nipple discharge noted  Nursing note and vitals reviewed.   ED Course  Procedures (including critical care time) Labs Review Labs Reviewed  CBC WITH DIFFERENTIAL/PLATELET - Abnormal; Notable for the following:    WBC 12.3 (*)    Neutro Abs 10.0 (*)    Monocytes Absolute 1.2 (*)    All other components within normal limits  BASIC METABOLIC PANEL    Imaging Review No results found. I have personally reviewed and evaluated these images and lab results as part of my medical decision-making.   EKG Interpretation None      MDM   Final diagnoses:  None    11:26 AM Surgery to  admit pt. Pt in agreement with plan   Teressa LowerVrinda Rikki Smestad, NP 06/17/15 1127  Teressa LowerVrinda Denisia Harpole, NP 06/17/15 1250  Lorre NickAnthony Allen, MD 06/21/15 2235

## 2015-06-17 NOTE — Progress Notes (Signed)
29 yr old female with no insurance coverage confirming she lives in [place] with breast pain for I&D Cm provided pt a list of uninsured providers/clinics near [facility] [place]   [facility] [address] [place], [place]- [id number]  [phone]  [facility] [place], [place]- [id number]  [phone]  [facility] [place]  [phone]  [facility] [place] [phone] FREE MEDICAL SERVICE  [place]-  [phone] ? [facility].  [facility] [place]  [phone]  [place] [facility] [place] [phone]  [facility] [place]  [phone]  [facility] [place]  [phone]  [facility] [place]  [phone]

## 2015-06-17 NOTE — Interval H&P Note (Signed)
History and Physical Interval Note:  06/17/2015 1:06 PM  [name]  has presented today for surgery, with the diagnosis of left breast abcess.  The various methods of treatment have been discussed with the patient and family. After consideration of risks, benefits and other options for treatment, the patient has consented to  Incision, drainage, and open packing of left breast abscess as a surgical intervention .    The patient's history has been reviewed, patient examined, no change in status, stable for surgery.  I have reviewed the patient's chart and labs.  Questions were answered to the patient's satisfaction.    Velora Hecklerodd M. Callaway Hardigree, MD, [facility] Office: [phone]   [name] MJudie Petit

## 2015-06-17 NOTE — ED Notes (Signed)
Pt states that she has been having abscess on left breast x 1 wk ago.  [facility] and [facility].  Not improving.

## 2015-06-17 NOTE — H&P (Signed)
Chief Complaint: left breast abscess  HPI: [name] is a healthy 29 year old female presenting with increasing left breast pain, swelling and erythema.  She was seen at [facility] on 12/12 at which time a US showed a 4.5x2.3x3.6cm fluid collection.  She was given Rx for bactrim and discharged home.  Renne count at this time normal, Tmax 102.  She presents today with worsening symptoms.  Denies any drainage, only skin peeling.  Denies previous episodes.  She is a 1ppd smoker.  WBC today 12.3k, afebrile with normal vital signs. We have been asked to see her for surgical evaluation.   Last PO intake 0630AM.  Past Medical History  Diagnosis Date  . Headache(784.0)   . Abnormal Pap smear   . Depression   . Bipolar 1 disorder [facility])     Past Surgical History  Procedure Laterality Date  . Colposcopy w/ biopsy / curettage    . Tonsillectomy    . Adenoidectomy    . Gallbladder surgery    . Bladder histogram      Family History  Problem Relation Age of Onset  . Hypothyroidism Mother   . Heart disease Father   . COPD Father   . Hypothyroidism Maternal Aunt   . Hypothyroidism Maternal Uncle   . Hypothyroidism Paternal Uncle   . Hypothyroidism Maternal Grandmother   . Breast cancer Maternal Grandmother   . Heart disease Maternal Grandfather    Social History:  reports that she has been smoking Cigarettes.  She has a 10 pack-year smoking history. She does not have any smokeless tobacco history on file. She reports that she does not drink alcohol or use illicit drugs.  Allergies: No Known Allergies   (Not in a hospital admission)  Results for orders placed or performed during the hospital encounter of 06/17/15 (from the past 48 hour(s))  CBC with Differential     Status: Abnormal   Collection Time: 06/17/15 10:49 AM  Result Value Ref Range   WBC 12.3 (H) 4.0 - 10.5 K/uL   RBC 4.34 3.87 - 5.11 MIL/uL   Hemoglobin 12.9 12.0 - 15.0 g/dL   HCT 16.1 09.6 - 04.5 %   MCV 89.9 78.0 - 100.0  fL   MCH 29.7 26.0 - 34.0 pg   MCHC 33.1 30.0 - 36.0 g/dL   RDW 40.9 81.1 - 91.4 %   Platelets 210 150 - 400 K/uL   Neutrophils Relative % 82 %   Neutro Abs 10.0 (H) 1.7 - 7.7 K/uL   Lymphocytes Relative 9 %   Lymphs Abs 1.1 0.7 - 4.0 K/uL   Monocytes Relative 9 %   Monocytes Absolute 1.2 (H) 0.1 - 1.0 K/uL   Eosinophils Relative 0 %   Eosinophils Absolute 0.0 0.0 - 0.7 K/uL   Basophils Relative 0 %   Basophils Absolute 0.0 0.0 - 0.1 K/uL   No results found.  Review of Systems  Constitutional: Positive for fever, malaise/fatigue and diaphoresis. Negative for chills and weight loss.  HENT: Negative.   Eyes: Negative.   Respiratory: Negative for cough, hemoptysis, sputum production and wheezing.   Cardiovascular: Negative for chest pain, palpitations, orthopnea, claudication, leg swelling and PND.  Gastrointestinal: Positive for nausea. Negative for vomiting, abdominal pain, diarrhea, constipation, blood in stool and melena.  Genitourinary: Negative for dysuria, urgency, frequency, hematuria and flank pain.  Neurological: Positive for weakness. Negative for dizziness, tingling and loss of consciousness.  Psychiatric/Behavioral: Negative for substance abuse. The patient is nervous/anxious.  Blood pressure 123/70, pulse 99, temperature 97.5 F (36.4 C), resp. rate 16, last menstrual period 05/31/2015, SpO2 100 %. Physical Exam  Constitutional: She is oriented to person, place, and time. She appears well-developed.  Cardiovascular: Normal rate, regular rhythm, normal heart sounds and intact distal pulses.  Exam reveals no gallop and no friction rub.   No murmur heard. Respiratory: Effort normal and breath sounds normal. No respiratory distress. She has no wheezes. She has no rales. She exhibits no tenderness.  GI: Soft. Bowel sounds are normal. She exhibits no distension and no mass. There is no tenderness. There is no rebound and no guarding.  Musculoskeletal: Normal range of  motion. She exhibits no edema or tenderness.  Neurological: She is alert and oriented to person, place, and time.  Skin:  4x4cm area of fluctuance to the left breast, medial to the areola with surrounding erythema which is fairly significant.  This was marked with a permanent marker.  Psychiatric: She has a normal mood and affect. Her behavior is normal. Judgment and thought content normal.     Assessment/Plan Left breast abscess-to OR for an I&D.  Surgical risks discussed with the patient including infection, bleeding, need for further debridement, open wounds, poor wound healing, anesthesia risks.  The patient verbalizes understanding and wishes to proceed ID-unasyn, Vanc FEN-NPO, IVF, pain control Dispo-to OR, then admit for IV antibiotics  Vic Esco ANP-BC  06/17/2015, 11:45 AM

## 2015-06-17 NOTE — Transfer of Care (Signed)
Immediate Anesthesia Transfer of Care Note  Patient: Shirley BuddLeeann N Charters  Procedure(s) Performed: Procedure(s): IRRIGATION AND DEBRIDEMENT ABSCESS LEFT BREAST (Left)  Patient Location: PACU  Anesthesia Type:General  Level of Consciousness:  sedated, patient cooperative and responds to stimulation  Airway & Oxygen Therapy:Patient Spontanous Breathing and Patient connected to face mask oxgen  Post-op Assessment:  Report given to PACU RN and Post -op Vital signs reviewed and stable  Post vital signs:  Reviewed and stable  Last Vitals:  Filed Vitals:   06/17/15 0959 06/17/15 1204  BP:  102/60  Pulse: 99 96  Temp:  36.4 C  Resp:  19    Complications: No apparent anesthesia complications

## 2015-06-18 LAB — CBC
HEMATOCRIT: 35 % — AB (ref 36.0–46.0)
HEMOGLOBIN: 11.5 g/dL — AB (ref 12.0–15.0)
MCH: 29.7 pg (ref 26.0–34.0)
MCHC: 32.9 g/dL (ref 30.0–36.0)
MCV: 90.4 fL (ref 78.0–100.0)
Platelets: 191 10*3/uL (ref 150–400)
RBC: 3.87 MIL/uL (ref 3.87–5.11)
RDW: 12 % (ref 11.5–15.5)
WBC: 8.7 10*3/uL (ref 4.0–10.5)

## 2015-06-18 MED ORDER — SODIUM CHLORIDE 0.9 % IJ SOLN
3.0000 mL | INTRAMUSCULAR | Status: DC | PRN
Start: 2015-06-18 — End: 2015-06-19

## 2015-06-18 MED ORDER — SODIUM CHLORIDE 0.9 % IJ SOLN
3.0000 mL | Freq: Two times a day (BID) | INTRAMUSCULAR | Status: DC
  Administered 2015-06-18: 3 mL via INTRAVENOUS

## 2015-06-18 MED ORDER — HYDROMORPHONE HCL 1 MG/ML IJ SOLN
1.0000 mg | INTRAMUSCULAR | Status: DC | PRN
  Administered 2015-06-18: 1 mg via INTRAVENOUS
  Filled 2015-06-18: qty 1

## 2015-06-18 NOTE — Op Note (Signed)
[name]:  [name], [name]                ACCOUNT NO.:  [id number]  MEDICAL RECORD NO.:  [id number]  LOCATION:  1538                         [facility]  PHYSICIAN:  Velora Hecklerodd M Akeem Heppler, MD      DATE OF BIRTH:  05-06-1986  DATE OF PROCEDURE:  06/17/2015                              OPERATIVE REPORT   PREOPERATIVE DIAGNOSIS:  Left breast abscess.  POSTOPERATIVE DIAGNOSIS:  Left breast abscess.  PROCEDURE:  Incision, drainage, and open packing of left breast abscess.  SURGEON:  Velora Hecklerodd M Natalia Wittmeyer, MD, FACS  ANESTHESIA:  General.  ESTIMATED BLOOD LOSS:  Minimal.  PREPARATION:  ChloraPrep.  COMPLICATIONS:  None.  INDICATIONS:  The patient is a 29 year old female who presents with a developing left breast abscess.  On exam, she is noted to have a large fluctuant mass at the medial aspect of the left areola.  There is extensive cellulitis of the left breast.  The patient now comes to the operating room for incision and drainage.  BODY OF REPORT:  Pro[facility].  The patient was brought to the operating room, placed in a supine position on the operating room table.  Following administration of general anesthesia, the patient was positioned and then prepped and draped in the usual aseptic fashion.  After ascertaining that an adequate level of anesthesia had been achieved, a circumareolar incision was made at the medial aspect of the left areola. Dissection was carried through subcutaneous tissues and into the abscess cavity.  A large volume of purulent thick fluid was evacuated.  Aerobic and anaerobic cultures were obtained and submitted to the Laboratory. Wound was opened widely and loculations were broken down with blunt dissection.  Cavity was irrigated copiously with warm saline and hemostasis was achieved with the electrocautery.  Cavity was packed with half-inch iodoform gauze packing.  Dry gauze dressings and an ABD pad were applied  and secured with a breast binder.  The patient was awakened from anesthesia and brought to the recovery room.  The patient tolerated the procedure well.   Velora Hecklerodd M. Merary Garguilo, MD, [facility] Office: [phone]    TMG/MEDQ  D:  06/17/2015  T:  06/18/2015  Job:  [id number]

## 2015-06-18 NOTE — Progress Notes (Signed)
Patient ID: [name], female   DOB: 1986/05/14, 29 y.o.   MRN: [id number]     [address] [place], [place] [address]    Phone: [phone] FAX: [phone]     Subjective: Sore. WBC normalized. No further fevers.  Objective:  Vital signs:  Filed Vitals:   06/17/15 2020 06/17/15 2214 06/18/15 0137 06/18/15 0504  BP: 1'13/66 97/56 99/56 ' 104/59  Pulse: 97 67 79 47  Temp: 97.7 F (36.5 C) 97.8 F (36.6 C) 97.7 F (36.5 C) 98 F (36.7 C)  TempSrc: Oral Axillary Oral Oral  Resp: '14 18 16 15  ' Height:      Weight:      SpO2: 99% 99% 96% 97%    Last BM Date: 06/13/15  Intake/Output   Yesterday:  12/15 0701 - 12/16 0700 In: 6010 [P.O.:240; I.V.:1165; IV Piggyback:100] Out: 1800 [Urine:1800] This shift: I/O last 3 completed shifts: In: 9323 [P.O.:240; I.V.:1165; IV Piggyback:100] Out: 1800 [Urine:1800]    Physical Exam: General: Pt awake/alert/oriented x4 in no acute distress Skin: improved erythema, wound is clean, blood drainage.    Problem List:   Principal Problem:   Breast abscess of female Active Problems:   Left breast abscess    Results:   Labs: Results for orders placed or performed during the hospital encounter of 06/17/15 (from the past 48 hour(s))  Basic metabolic panel     Status: Abnormal   Collection Time: 06/17/15 10:49 AM  Result Value Ref Range   Sodium 134 (L) 135 - 145 mmol/L   Potassium 4.2 3.5 - 5.1 mmol/L   Chloride 101 101 - 111 mmol/L   CO2 23 22 - 32 mmol/L   Glucose, Bld 77 65 - 99 mg/dL   BUN 8 6 - 20 mg/dL   Creatinine, Ser 0.67 0.44 - 1.00 mg/dL   Calcium 8.8 (L) 8.9 - 10.3 mg/dL   GFR calc non Af Amer >60 >60 mL/min   GFR calc Af Amer >60 >60 mL/min    Comment: (NOTE) The eGFR has been calculated using the CKD EPI equation. This calculation has not been validated in all clinical situations. eGFR's persistently <60 mL/min signify possible Chronic  Kidney Disease.    Anion gap 10 5 - 15  CBC with Differential     Status: Abnormal   Collection Time: 06/17/15 10:49 AM  Result Value Ref Range   WBC 12.3 (H) 4.0 - 10.5 K/uL   RBC 4.34 3.87 - 5.11 MIL/uL   Hemoglobin 12.9 12.0 - 15.0 g/dL   HCT 39.0 36.0 - 46.0 %   MCV 89.9 78.0 - 100.0 fL   MCH 29.7 26.0 - 34.0 pg   MCHC 33.1 30.0 - 36.0 g/dL   RDW 12.1 11.5 - 15.5 %   Platelets 210 150 - 400 K/uL   Neutrophils Relative % 82 %   Neutro Abs 10.0 (H) 1.7 - 7.7 K/uL   Lymphocytes Relative 9 %   Lymphs Abs 1.1 0.7 - 4.0 K/uL   Monocytes Relative 9 %   Monocytes Absolute 1.2 (H) 0.1 - 1.0 K/uL   Eosinophils Relative 0 %   Eosinophils Absolute 0.0 0.0 - 0.7 K/uL   Basophils Relative 0 %   Basophils Absolute 0.0 0.0 - 0.1 K/uL  CBC     Status: Abnormal   Collection Time: 06/18/15  4:36 AM  Result Value Ref Range   WBC 8.7 4.0 - 10.5  K/uL   RBC 3.87 3.87 - 5.11 MIL/uL   Hemoglobin 11.5 (L) 12.0 - 15.0 g/dL   HCT 35.0 (L) 36.0 - 46.0 %   MCV 90.4 78.0 - 100.0 fL   MCH 29.7 26.0 - 34.0 pg   MCHC 32.9 30.0 - 36.0 g/dL   RDW 12.0 11.5 - 15.5 %   Platelets 191 150 - 400 K/uL    Imaging / Studies: No results found.  Medications / Allergies:  Scheduled Meds: . ampicillin-sulbactam (UNASYN) IV  3 g Intravenous Q6H  . enoxaparin (LOVENOX) injection  40 mg Subcutaneous Q24H  . norgestimate-ethinyl estradiol  1 tablet Oral Daily  . vancomycin  1,000 mg Intravenous Q8H   Continuous Infusions: . lactated ringers 75 mL/hr at 06/17/15 1748   PRN Meds:.acetaminophen **OR** acetaminophen, diphenhydrAMINE **OR** diphenhydrAMINE, HYDROcodone-acetaminophen, HYDROmorphone (DILAUDID) injection, ibuprofen, ondansetron **OR** ondansetron (ZOFRAN) IV, oxyCODONE, zolpidem  Antibiotics: Anti-infectives    Start     Dose/Rate Route Frequency Ordered Stop   06/17/15 2000  vancomycin (VANCOCIN) IVPB 1000 mg/200 mL premix     1,000 mg 200 mL/hr over 60 Minutes Intravenous Every 8 hours  06/17/15 1314     06/17/15 1230  Ampicillin-Sulbactam (UNASYN) 3 g in sodium chloride 0.9 % 100 mL IVPB     3 g 100 mL/hr over 60 Minutes Intravenous Every 6 hours 06/17/15 1223     06/17/15 1045  vancomycin (VANCOCIN) IVPB 1000 mg/200 mL premix     1,000 mg 200 mL/hr over 60 Minutes Intravenous  Once 06/17/15 1044 06/17/15 1221        Assessment/Plan POD#1 I&D left breast abscess---Dr. Harlow Asa Cellulitis  -continue IV antibiotics for additional day for cellulitis -BID Wet to dry dressing changes, instruct family members ID-unasyn, Vanc FEN-encourage PO pain meds.  SLIV Dispo-anticipate DC in AM  Erby Pian, [facility] Pager [phone](7A-[phone])   06/18/2015 7:39 AM

## 2015-06-19 MED ORDER — AMOXICILLIN-POT CLAVULANATE 875-125 MG PO TABS: 1.0000 | ORAL_TABLET | Freq: Two times a day (BID) | ORAL | Status: DC

## 2015-06-19 MED ORDER — FLUCONAZOLE 150 MG PO TABS
150.0000 mg | ORAL_TABLET | Freq: Once | ORAL | Status: AC
  Administered 2015-06-19: 150 mg via ORAL
  Filled 2015-06-19: qty 1

## 2015-06-19 MED ORDER — OXYCODONE HCL 5 MG PO TABS: 5.0000 mg | ORAL_TABLET | Freq: Four times a day (QID) | ORAL | Status: DC | PRN

## 2015-06-19 NOTE — Discharge Summary (Signed)
Physician Discharge Summary  Patient ID:  [name]  MRN: [id number]  DOB/AGE: Mar 20, 1986 29 y.o.  Admit date: 06/17/2015 Discharge date: 06/19/2015  Discharge Diagnoses:  1.  Left breast abscess - polymicrobial  2.  Smokes 3.  History of depression and bipolar disorder   Principal Problem:   Breast abscess of female Active Problems:   Left breast abscess   Operation: Procedure(s): IRRIGATION AND DEBRIDEMENT ABSCESS LEFT BREAST on 06/17/2015 - [facility]  Discharged Condition: good  Hospital Course: Shirley LassoLeeann N Dave is an 29 y.o. female whose primary care physician is No primary care provider on file. and who was admitted 06/17/2015 with a chief complaint of  Chief Complaint  Patient presents with  . Abscess  .   She was brought to the operating room on 06/17/2015 and underwent  IRRIGATION AND DEBRIDEMENT ABSCESS LEFT BREAST.  Cultures show gram positive cocci, gram neg rods, gram positive rods.  Final cultures are pending.  She is sore, but better.  The redness, as outlined on skin, looks better. Boyfriend, Jamar, at bedside.   The discharge instructions were reviewed with the patient.  Consults: None  Significant Diagnostic Studies: Results for orders placed or performed during the hospital encounter of 06/17/15  Anaerobic culture  Result Value Ref Range   Specimen Description ABSCESS LEFT BREAST    Special Requests PATIENT ON FOLLOWING UNASYN AND VANCOMYCIN    Gram Stain      ABUNDANT WBC PRESENT, PREDOMINANTLY MONONUCLEAR RARE SQUAMOUS EPITHELIAL CELLS PRESENT ABUNDANT GRAM POSITIVE COCCI IN PAIRS ABUNDANT GRAM NEGATIVE RODS MODERATE GRAM POSITIVE RODS    Culture PENDING    Report Status PENDING   Culture, routine-abscess  Result Value Ref Range   Specimen Description ABSCESS LEFT BREAST    Special Requests PATIENT ON FOLLOWING UNASYN AND VANCOMYCIN    Gram Stain      ABUNDANT WBC PRESENT, PREDOMINANTLY MONONUCLEAR RARE SQUAMOUS EPITHELIAL CELLS  PRESENT ABUNDANT GRAM POSITIVE COCCI IN PAIRS ABUNDANT GRAM NEGATIVE RODS MODERATE GRAM POSITIVE RODS    Culture PENDING    Report Status PENDING   Basic metabolic panel  Result Value Ref Range   Sodium 134 (L) 135 - 145 mmol/L   Potassium 4.2 3.5 - 5.1 mmol/L   Chloride 101 101 - 111 mmol/L   CO2 23 22 - 32 mmol/L   Glucose, Bld 77 65 - 99 mg/dL   BUN 8 6 - 20 mg/dL   Creatinine, Ser 4.090.67 0.44 - 1.00 mg/dL   Calcium 8.8 (L) 8.9 - 10.3 mg/dL   GFR calc non Af Amer >60 >60 mL/min   GFR calc Af Amer >60 >60 mL/min   Anion gap 10 5 - 15  CBC with Differential  Result Value Ref Range   WBC 12.3 (H) 4.0 - 10.5 K/uL   RBC 4.34 3.87 - 5.11 MIL/uL   Hemoglobin 12.9 12.0 - 15.0 g/dL   HCT 81.139.0 91.436.0 - 78.246.0 %   MCV 89.9 78.0 - 100.0 fL   MCH 29.7 26.0 - 34.0 pg   MCHC 33.1 30.0 - 36.0 g/dL   RDW 95.612.1 21.311.5 - 08.615.5 %   Platelets 210 150 - 400 K/uL   Neutrophils Relative % 82 %   Neutro Abs 10.0 (H) 1.7 - 7.7 K/uL   Lymphocytes Relative 9 %   Lymphs Abs 1.1 0.7 - 4.0 K/uL   Monocytes Relative 9 %   Monocytes Absolute 1.2 (H) 0.1 - 1.0 K/uL   Eosinophils Relative 0 %   Eosinophils  Absolute 0.0 0.0 - 0.7 K/uL   Basophils Relative 0 %   Basophils Absolute 0.0 0.0 - 0.1 K/uL  CBC  Result Value Ref Range   WBC 8.7 4.0 - 10.5 K/uL   RBC 3.87 3.87 - 5.11 MIL/uL   Hemoglobin 11.5 (L) 12.0 - 15.0 g/dL   HCT 45.4 (L) 09.8 - 11.9 %   MCV 90.4 78.0 - 100.0 fL   MCH 29.7 26.0 - 34.0 pg   MCHC 32.9 30.0 - 36.0 g/dL   RDW 14.7 82.9 - 56.2 %   Platelets 191 150 - 400 K/uL    US Breast Ltd Uni Left Inc Axilla  06/14/2015  CLINICAL DATA:  LEFT breast pain for 3 days, redness, question breast abscess ; removed a nipple piercing 3 days ago EXAM: EMERGENT ULTRASOUND OF THE LEFT BREAST COMPARISON:  None FINDINGS: Targeted ultrasound is performed at the area of clinical concern in the LEFT breast. At this site, a complex hypoechoic collection is identified measuring 4.5 x 2.3 x 3.6 cm. Lesion is  located retro areolar and corresponds to an area of visible redness and tenderness extending medially. Color Doppler imaging demonstrates no blood flow within the collection but does demonstrate slightly increased blood flow at the periphery surrounding the collection. Findings are suspicious for inflammatory process such as breast abscess. IMPRESSION: 4.5 x 2.3 x 3.6 cm diameter complex fluid collection in the subcutaneous tissues suspicious for breast abscess. RECOMMENDATION: Clinical management of the site recommended. This collection could be aspirated under ultrasound guidance if clinically indicated. This exam was performed emergently and does not constitute an adequate assessment to exclude a breast malignancy. Close clinical followup including serial breast physical exam recommended with consideration of follow-up mammography and ultrasound as indicated. Findings discussed with Tammy TripletT at time of interpretation, 1730 hours on 06/14/2015. BI-RADS CATEGORY  0: Incomplete. Need additional imaging evaluation and/or prior mammograms for comparison. Electronically Signed   By: Ulyses Southward M.D.   On: 06/14/2015 17:34    Discharge Exam:  Filed Vitals:   06/18/15 2115 06/19/15 0525  BP: 112/83 104/60  Pulse: 84 70  Temp: 98.2 F (36.8 C) 98.5 F (36.9 C)  Resp: 18 18    General: Overweight WF who is alert and generally healthy appearing.  Lungs: Clear to auscultation and symmetric breath sounds. Heart:  RRR. No murmur or rub. Breast:  Left - open wound in the medial areola.  Looks fairly good.  Discharge Medications:     Medication List    ASK your doctor about these medications        HYDROcodone-acetaminophen 5-325 MG tablet  Commonly known as:  NORCO/VICODIN  Take one-two tabs po q 4-6 hrs prn pain     ibuprofen 200 MG tablet  Commonly known as:  ADVIL,MOTRIN  Take 600 mg by mouth every 6 (six) hours as needed for fever, headache or moderate pain.     norgestimate-ethinyl  estradiol 0.25-35 MG-MCG tablet  Commonly known as:  ORTHO-CYCLEN,SPRINTEC,PREVIFEM  Take 1 tablet by mouth daily.     sulfamethoxazole-trimethoprim 800-160 MG tablet  Commonly known as:  BACTRIM DS,SEPTRA DS  Take 1 tablet by mouth 2 (two) times daily. For 10 days        Disposition: 01-Home or Self Care        Follow-up Information    Follow up with These are uninsured providers/clinics you may use to get follow up care . Call on 06/18/2015.   Why:  [facility] [phone] [address] [place] [phone]   Contact information:   [facility] [address] [place], [place] - [address] [phone] [facility] [place], [place] - [address] [phone] [facility]  [phone] PLEASE USE LIST CASE MANAGER GAVE YOU     Activity:  Driving - May drive in 2 or 3 days, if doing well   Lifting - No lifting more than 15 pounds for 5 days, then no limit  Wound Care:   Shower twice a day.  Change dressing twice a day.  Diet:  As tolerated  Follow up appointment:  Call Dr. Ardine Eng office [facility]) at [phone] for an appointment in 7 to 14 days.  Medications and dosages:  Resume your home medications.  You have a prescription for:  Augmentin, Percocet  Signed: Ovidio Kin, M.D., [facility] Office:  [phone]  06/19/2015, 8:12 AM

## 2015-06-19 NOTE — Progress Notes (Signed)
Pt's dsg to left breast changed using NS wet to dry 4x4 gauze.  Pt's mother and boyfriend at bedside watching dsg being changed by nurse and asking questions but not participating in dsg change.  Pt's mother voiced understanding of dsg change

## 2015-06-19 NOTE — Discharge Instructions (Signed)
[facility] - DISCHARGE INSTRUCTIONS TO PATIENT  Activity:  Driving - May drive in 2 or 3 days, if doing well   Lifting - No lifting more than 15 pounds for 5 days, then no limit  Wound Care:   Shower twice a day.  Change dressing twice a day.  Diet:  As tolerated  Follow up appointment:  Call Dr. Ardine EngGerkin's office [facility]) at [phone] for an appointment in 7 to 14 days.  Medications and dosages:  Resume your home medications.  You have a prescription for:  Augmentin, Percocet  Call Dr. Gerrit FriendsGerkin or his office  [phone]) if you have:  Temperature greater than 100.4,  Persistent nausea and vomiting,  Severe uncontrolled pain,  Redness, tenderness, or signs of infection (pain, swelling, redness, odor or green/yellow discharge around the site),  Any other questions or concerns you may have after discharge.  In an emergency, call 911 or go to an Emergency Department at a nearby hospital.

## 2015-06-19 NOTE — Progress Notes (Signed)
Assessment unchanged. Pt and boyfriend verbalized understanding of dc instructions through teach back including follow up care and when to call the doctor. Both verbalized understanding of dressing changes to be done BID at home after dc. Ongoing teaching done since yesterday regarding dressing with pt's mother and boyfriend who both assisted with changes. Pt reported "my mother is going to help me at night and my boyfriend in the morning." Dressing supplies provided as needed for dsg changes. Scripts x 2 given as provided by MD. Discharged via wc to front entrance to meet mother and awaiting vehicle to carry home. Accompanied by boyfriend and NT.

## 2015-06-22 LAB — CULTURE, ROUTINE-ABSCESS: CULTURE: NO GROWTH

## 2015-06-23 LAB — ANAEROBIC CULTURE

## 2015-08-17 ENCOUNTER — Encounter (HOSPITAL_COMMUNITY): Payer: Self-pay | Admitting: Emergency Medicine

## 2015-08-17 ENCOUNTER — Emergency Department (HOSPITAL_COMMUNITY)
Admission: EM | Admit: 2015-08-17 | Discharge: 2015-08-17 | Disposition: A | Discharge status: Home / Self Care | Payer: MEDICAID | Attending: Emergency Medicine | Admitting: Emergency Medicine

## 2015-08-17 DIAGNOSIS — Z793 Long term (current) use of hormonal contraceptives: Secondary | ICD-10-CM | POA: Insufficient documentation

## 2015-08-17 DIAGNOSIS — Z8659 Personal history of other mental and behavioral disorders: Secondary | ICD-10-CM | POA: Insufficient documentation

## 2015-08-17 DIAGNOSIS — B9689 Other specified bacterial agents as the cause of diseases classified elsewhere: Secondary | ICD-10-CM

## 2015-08-17 DIAGNOSIS — N390 Urinary tract infection, site not specified: Secondary | ICD-10-CM | POA: Insufficient documentation

## 2015-08-17 DIAGNOSIS — N760 Acute vaginitis: Secondary | ICD-10-CM | POA: Insufficient documentation

## 2015-08-17 DIAGNOSIS — F17210 Nicotine dependence, cigarettes, uncomplicated: Secondary | ICD-10-CM | POA: Insufficient documentation

## 2015-08-17 DIAGNOSIS — Z3202 Encounter for pregnancy test, result negative: Secondary | ICD-10-CM | POA: Insufficient documentation

## 2015-08-17 DIAGNOSIS — Z792 Long term (current) use of antibiotics: Secondary | ICD-10-CM | POA: Insufficient documentation

## 2015-08-17 LAB — PREGNANCY, URINE: Preg Test, Ur: NEGATIVE

## 2015-08-17 LAB — WET PREP, GENITAL
SPERM: NONE SEEN
TRICH WET PREP: NONE SEEN
WBC, Wet Prep HPF POC: NONE SEEN
YEAST WET PREP: NONE SEEN

## 2015-08-17 LAB — URINALYSIS, ROUTINE W REFLEX MICROSCOPIC
GLUCOSE, UA: NEGATIVE mg/dL
KETONES UR: NEGATIVE mg/dL
Leukocytes, UA: NEGATIVE
Nitrite: NEGATIVE
PROTEIN: 30 mg/dL — AB
Specific Gravity, Urine: >1.03 — ABNORMAL HIGH (ref 1.005–1.030)
pH: 5.5 (ref 5.0–8.0)

## 2015-08-17 LAB — URINE MICROSCOPIC-ADD ON: WBC, UA: NONE SEEN WBC/hpf (ref 0–5)

## 2015-08-17 MED ORDER — METRONIDAZOLE 500 MG PO TABS: 2000.0000 mg | ORAL_TABLET | Freq: Once | ORAL | Status: DC

## 2015-08-17 MED ORDER — CEPHALEXIN 500 MG PO CAPS
500.0000 mg | ORAL_CAPSULE | Freq: Once | ORAL | Status: AC
  Administered 2015-08-17: 500 mg via ORAL
  Filled 2015-08-17: qty 1

## 2015-08-17 MED ORDER — IBUPROFEN 800 MG PO TABS: 800.0000 mg | ORAL_TABLET | Freq: Three times a day (TID) | ORAL | Status: DC | PRN

## 2015-08-17 MED ORDER — PROMETHAZINE HCL 25 MG PO TABS: 25.0000 mg | ORAL_TABLET | Freq: Four times a day (QID) | ORAL | Status: AC | PRN

## 2015-08-17 MED ORDER — METRONIDAZOLE 500 MG PO TABS
500.0000 mg | ORAL_TABLET | Freq: Once | ORAL | Status: AC
  Administered 2015-08-17: 500 mg via ORAL
  Filled 2015-08-17: qty 1

## 2015-08-17 MED ORDER — CEPHALEXIN 500 MG PO CAPS: 500.0000 mg | ORAL_CAPSULE | Freq: Four times a day (QID) | ORAL | Status: AC

## 2015-08-17 MED ORDER — METRONIDAZOLE 500 MG PO TABS: 500.0000 mg | ORAL_TABLET | Freq: Two times a day (BID) | ORAL | Status: DC

## 2015-08-17 NOTE — ED Provider Notes (Signed)
CHIEF COMPLAINT: Dysuria, hematuria  HPI: Pt is a 30 y.o. female with history of depression and bipolar disorder who presents to the emergency department with complaints of several days of dysuria and hematuria. She is sexually active with one female partner and she thinks that he may have recently cheated on her. She is a G3 P2 A1. Her last menstrual period was 2-1/2 weeks ago. She denies any vaginal bleeding but has had some discharge. Has noticed a foul odor as well. Some pain in her lower back but no history of kidney stones. Denies vomiting or diarrhea but has had nausea. Is status post cholecystectomy.  ROS: See HPI Constitutional: no fever  Eyes: no drainage  ENT: no runny nose   Cardiovascular:  no chest pain  Resp: no SOB  GI: no vomiting GU:  dysuria Integumentary: no rash  Allergy: no hives  Musculoskeletal: no leg swelling  Neurological: no slurred speech ROS otherwise negative  PAST MEDICAL HISTORY/PAST SURGICAL HISTORY:  Past Medical History  Diagnosis Date  . Headache(784.0)   . Abnormal Pap smear   . Depression   . Bipolar 1 disorder (HCC)     MEDICATIONS:  Prior to Admission medications   Medication Sig Start Date End Date Taking? Authorizing Provider  norgestimate-ethinyl estradiol (ORTHO-CYCLEN,SPRINTEC,PREVIFEM) 0.25-35 MG-MCG tablet Take 1 tablet by mouth daily. 07/22/12  Yes Lisa A Leftwich-Kirby, CNM  cephALEXin (KEFLEX) 500 MG capsule Take 1 capsule (500 mg total) by mouth 4 (four) times daily. 08/17/15   Kristen N Ward, DO  ibuprofen (ADVIL,MOTRIN) 800 MG tablet Take 1 tablet (800 mg total) by mouth every 8 (eight) hours as needed for mild pain. 08/17/15   Kristen N Ward, DO  metroNIDAZOLE (FLAGYL) 500 MG tablet Take 1 tablet (500 mg total) by mouth 2 (two) times daily. 08/17/15   Kristen N Ward, DO  promethazine (PHENERGAN) 25 MG tablet Take 1 tablet (25 mg total) by mouth every 6 (six) hours as needed for nausea or vomiting. 08/17/15   Layla Maw Ward, DO     ALLERGIES:  No Known Allergies  SOCIAL HISTORY:  Social History  Substance Use Topics  . Smoking status: Current Every Day Smoker -- 1.00 packs/day for 10 years    Types: Cigarettes  . Smokeless tobacco: Not on file  . Alcohol Use: No    FAMILY HISTORY: Family History  Problem Relation Age of Onset  . Hypothyroidism Mother   . Heart disease Father   . COPD Father   . Hypothyroidism Maternal Aunt   . Hypothyroidism Maternal Uncle   . Hypothyroidism Paternal Uncle   . Hypothyroidism Maternal Grandmother   . Breast cancer Maternal Grandmother   . Heart disease Maternal Grandfather     EXAM: BP 99/63 mmHg  Pulse 87  Temp(Src) 98 F (36.7 C) (Oral)  Resp 18  Ht  (1.6 m)  Wt 195 lb (88.451 kg)  BMI 34.55 kg/m2  SpO2 100%  LMP 08/03/2015 CONSTITUTIONAL: Alert and oriented and responds appropriately to questions. Well-appearing; well-nourished HEAD: Normocephalic EYES: Conjunctivae clear, PERRL ENT: normal nose; no rhinorrhea; moist mucous membranes; pharynx without lesions noted NECK: Supple, no meningismus, no LAD  CARD: RRR; S1 and S2 appreciated; no murmurs, no clicks, no rubs, no gallops RESP: Normal chest excursion without splinting or tachypnea; breath sounds clear and equal bilaterally; no wheezes, no rhonchi, no rales, no hypoxia or respiratory distress, speaking full sentences ABD/GI: Normal bowel sounds; non-distended; soft, non-tender, no rebound, no guarding, no peritoneal signs Normal  external genitalia. No lesions, rashes noted. Patient has no vaginal bleeding on exam. Small amount of thin Buhman vaginal discharge.  No adnexal tenderness or fullness, no cervical motion tenderness. Cervix is not appear friable.   BACK:  The back appears normal and is non-tender to palpation, there is no CVA tenderness EXT: Normal ROM in all joints; non-tender to palpation; no edema; normal capillary refill; no cyanosis, no calf tenderness or swelling    SKIN: Normal  color for age and race; warm NEURO: Moves all extremities equally, sensation to light touch intact diffusely, cranial nerves II through XII intact PSYCH: The patient's mood and manner are appropriate. Grooming and personal hygiene are appropriate.  MEDICAL DECISION MAKING: Patient here with dysuria, vaginal discharge. No vaginal bleeding on exam. Abdominal exam and pelvic exam unremarkable. Nothing on exam to suggest TOA, torsion, PID. She is not pregnant. Doubt appendicitis given benign exam. Urine shows large hemoglobin and many bacteria. Culture is pending. I do not think that she is having a kidney stone as she has no back pain currently. I suspect that this is a UTI. She does have clue cells on her wet prep. We'll discharge with Keflex and Flagyl. She chooses to wait for her coronary chlamydia cultures to come back. She does not want empiric treatment I feel this is reasonable plan. Have offered her HIV and syphilis screening given she is concerned that her partner has not been faithful but she declines this. Discussed return precautions. She verbalized understanding and is comfortable with this plan.       Layla Maw Ward, DO 08/17/15 1007

## 2015-08-17 NOTE — ED Notes (Signed)
Patient noticed some blood in urine on Friday, currently having painful urination and back pain.

## 2015-08-17 NOTE — Discharge Instructions (Signed)
Bacterial Vaginosis °Bacterial vaginosis is a vaginal infection that occurs when the normal balance of bacteria in the vagina is disrupted. It results from an overgrowth of certain bacteria. This is the most common vaginal infection in women of childbearing age. Treatment is important to prevent complications, especially in pregnant women, as it can cause a premature delivery. °CAUSES  °Bacterial vaginosis is caused by an increase in harmful bacteria that are normally present in smaller amounts in the vagina. Several different kinds of bacteria can cause bacterial vaginosis. However, the reason that the condition develops is not fully understood. °RISK FACTORS °Certain activities or behaviors can put you at an increased risk of developing bacterial vaginosis, including: °· Having a new sex partner or multiple sex partners. °· Douching. °· Using an intrauterine device (IUD) for contraception. °Women do not get bacterial vaginosis from toilet seats, bedding, swimming pools, or contact with objects around them. °SIGNS AND SYMPTOMS  °Some women with bacterial vaginosis have no signs or symptoms. Common symptoms include: °· Grey vaginal discharge. °· A fishlike odor with discharge, especially after sexual intercourse. °· Itching or burning of the vagina and vulva. °· Burning or pain with urination. °DIAGNOSIS  °Your health care provider will take a medical history and examine the vagina for signs of bacterial vaginosis. A sample of vaginal fluid may be taken. Your health care provider will look at this sample under a microscope to check for bacteria and abnormal cells. A vaginal pH test may also be done.  °TREATMENT  °Bacterial vaginosis may be treated with antibiotic medicines. These may be given in the form of a pill or a vaginal cream. A second round of antibiotics may be prescribed if the condition comes back after treatment. Because bacterial vaginosis increases your risk for sexually transmitted diseases, getting  treated can help reduce your risk for chlamydia, gonorrhea, HIV, and herpes. °HOME CARE INSTRUCTIONS  °· Only take over-the-counter or prescription medicines as directed by your health care provider. °· If antibiotic medicine was prescribed, take it as directed. Make sure you finish it even if you start to feel better. °· Tell all sexual partners that you have a vaginal infection. They should see their health care provider and be treated if they have problems, such as a mild rash or itching. °· During treatment, it is important that you follow these instructions: °· Avoid sexual activity or use condoms correctly. °· Do not douche. °· Avoid alcohol as directed by your health care provider. °· Avoid breastfeeding as directed by your health care provider. °SEEK MEDICAL CARE IF:  °· Your symptoms are not improving after 3 days of treatment. °· You have increased discharge or pain. °· You have a fever. °MAKE SURE YOU:  °· Understand these instructions. °· Will watch your condition. °· Will get help right away if you are not doing well or get worse. °FOR MORE INFORMATION  °Centers for Disease Control and Prevention, Division of STD Prevention: www.cdc.gov/std °American Sexual Health Association (ASHA): www.ashastd.org  °  °This information is not intended to replace advice given to you by your health care provider. Make sure you discuss any questions you have with your health care provider. °  °Document Released: 06/19/2005 Document Revised: 07/10/2014 Document Reviewed: 01/29/2013 °Elsevier Interactive Patient Education ©2016 Elsevier Inc. ° °Urinary Tract Infection °Urinary tract infections (UTIs) can develop anywhere along your urinary tract. Your urinary tract is your body's drainage system for removing wastes and extra water. Your urinary tract includes two kidneys, two ureters,   a bladder, and a urethra. Your kidneys are a pair of bean-shaped organs. Each kidney is about the size of your fist. They are located below  your ribs, one on each side of your spine. °CAUSES °Infections are caused by microbes, which are microscopic organisms, including fungi, viruses, and bacteria. These organisms are so small that they can only be seen through a microscope. Bacteria are the microbes that most commonly cause UTIs. °SYMPTOMS  °Symptoms of UTIs may vary by age and gender of the patient and by the location of the infection. Symptoms in young women typically include a frequent and intense urge to urinate and a painful, burning feeling in the bladder or urethra during urination. Older women and men are more likely to be tired, shaky, and weak and have muscle aches and abdominal pain. A fever may mean the infection is in your kidneys. Other symptoms of a kidney infection include pain in your back or sides below the ribs, nausea, and vomiting. °DIAGNOSIS °To diagnose a UTI, your caregiver will ask you about your symptoms. Your caregiver will also ask you to provide a urine sample. The urine sample will be tested for bacteria and Keesling blood cells. Kannan blood cells are made by your body to help fight infection. °TREATMENT  °Typically, UTIs can be treated with medication. Because most UTIs are caused by a bacterial infection, they usually can be treated with the use of antibiotics. The choice of antibiotic and length of treatment depend on your symptoms and the type of bacteria causing your infection. °HOME CARE INSTRUCTIONS °· If you were prescribed antibiotics, take them exactly as your caregiver instructs you. Finish the medication even if you feel better after you have only taken some of the medication. °· Drink enough water and fluids to keep your urine clear or pale yellow. °· Avoid caffeine, tea, and carbonated beverages. They tend to irritate your bladder. °· Empty your bladder often. Avoid holding urine for long periods of time. °· Empty your bladder before and after sexual intercourse. °· After a bowel movement, women should cleanse  from front to back. Use each tissue only once. °SEEK MEDICAL CARE IF:  °· You have back pain. °· You develop a fever. °· Your symptoms do not begin to resolve within 3 days. °SEEK IMMEDIATE MEDICAL CARE IF:  °· You have severe back pain or lower abdominal pain. °· You develop chills. °· You have nausea or vomiting. °· You have continued burning or discomfort with urination. °MAKE SURE YOU:  °· Understand these instructions. °· Will watch your condition. °· Will get help right away if you are not doing well or get worse. °  °This information is not intended to replace advice given to you by your health care provider. Make sure you discuss any questions you have with your health care provider. °  °Document Released: 03/29/2005 Document Revised: 03/10/2015 Document Reviewed: 07/28/2011 °Elsevier Interactive Patient Education ©2016 Elsevier Inc. ° °

## 2015-08-18 LAB — URINE CULTURE

## 2015-08-18 LAB — GC/CHLAMYDIA PROBE AMP (~~LOC~~) NOT AT ARMC
Chlamydia: NEGATIVE
Neisseria Gonorrhea: NEGATIVE

## 2015-08-24 ENCOUNTER — Telehealth (HOSPITAL_BASED_OUTPATIENT_CLINIC_OR_DEPARTMENT_OTHER): Payer: Self-pay | Admitting: Emergency Medicine

## 2015-09-16 ENCOUNTER — Emergency Department (HOSPITAL_COMMUNITY)
Admission: EM | Admit: 2015-09-16 | Discharge: 2015-09-16 | Disposition: A | Discharge status: Home / Self Care | Payer: Self-pay | Attending: Emergency Medicine | Admitting: Emergency Medicine

## 2015-09-16 ENCOUNTER — Encounter (HOSPITAL_COMMUNITY): Payer: Self-pay | Admitting: Emergency Medicine

## 2015-09-16 DIAGNOSIS — J069 Acute upper respiratory infection, unspecified: Secondary | ICD-10-CM | POA: Insufficient documentation

## 2015-09-16 DIAGNOSIS — F329 Major depressive disorder, single episode, unspecified: Secondary | ICD-10-CM | POA: Insufficient documentation

## 2015-09-16 DIAGNOSIS — F17210 Nicotine dependence, cigarettes, uncomplicated: Secondary | ICD-10-CM | POA: Insufficient documentation

## 2015-09-16 MED ORDER — GUAIFENESIN-CODEINE 100-10 MG/5ML PO SOLN
10.0000 mL | Freq: Once | ORAL | Status: AC
  Administered 2015-09-16: 10 mL via ORAL
  Filled 2015-09-16: qty 10

## 2015-09-16 MED ORDER — IBUPROFEN 800 MG PO TABS
800.0000 mg | ORAL_TABLET | Freq: Once | ORAL | Status: AC
  Administered 2015-09-16: 800 mg via ORAL
  Filled 2015-09-16: qty 1

## 2015-09-16 MED ORDER — GUAIFENESIN-CODEINE 100-10 MG/5ML PO SYRP: 10.0000 mL | ORAL_SOLUTION | Freq: Three times a day (TID) | ORAL | Status: AC | PRN

## 2015-09-16 MED ORDER — IBUPROFEN 800 MG PO TABS: 800.0000 mg | ORAL_TABLET | Freq: Three times a day (TID) | ORAL | Status: AC

## 2015-09-16 NOTE — ED Notes (Signed)
Pt states she has had body aches, fever, chills, and a bad cough for the past 3-4 days.  Last had Ibuprofen 400mg  5 hours ago.

## 2015-09-16 NOTE — Discharge Instructions (Signed)

## 2015-09-18 NOTE — ED Provider Notes (Signed)
CSN: [id number]     Arrival date & time 09/16/15  1126 History   First MD Initiated Contact with Patient 09/16/15 1134     Chief Complaint  Patient presents with  . Generalized Body Aches     (Consider location/radiation/quality/duration/timing/severity/associated sxs/prior Treatment) HPI  CELESTINE PRIM is a 30 y.o. female who presents to the Emergency Department complaining of  generalized body aches, fever, chills and cough. Symptoms have been present for 3-4 days. She states the cough is nonproductive. She has taken ibuprofen at home without relief. She also reports decreased appetite.  She denies chest pain, shortness breath, abdominal pain, vomiting or diarrhea. She states nothing has made her symptoms better or worse.   Past Medical History  Diagnosis Date  . Headache(784.0)   . Abnormal Pap smear   . Depression   . Bipolar 1 disorder [facility])    Past Surgical History  Procedure Laterality Date  . Colposcopy w/ biopsy / curettage    . Tonsillectomy    . Adenoidectomy    . Gallbladder surgery    . Bladder histogram    . Irrigation and debridement abscess Left 06/17/2015    Procedure: IRRIGATION AND DEBRIDEMENT ABSCESS LEFT BREAST;  Surgeon: Darnell Level, MD;  Location: [facility] ORS;  Service: General;  Laterality: Left;  . Breast surgery     Family History  Problem Relation Age of Onset  . Hypothyroidism Mother   . Heart disease Father   . COPD Father   . Hypothyroidism Maternal Aunt   . Hypothyroidism Maternal Uncle   . Hypothyroidism Paternal Uncle   . Hypothyroidism Maternal Grandmother   . Breast cancer Maternal Grandmother   . Heart disease Maternal Grandfather    Social History  Substance Use Topics  . Smoking status: Current Every Day Smoker -- 1.00 packs/day for 10 years    Types: Cigarettes  . Smokeless tobacco: None  . Alcohol Use: No   OB History    Gravida Para Term Preterm AB TAB SAB Ectopic Multiple Living   Review of Systems   Constitutional: Positive for fever and chills. Negative for activity change and appetite change.  HENT: Positive for congestion and rhinorrhea. Negative for facial swelling, sore throat and trouble swallowing.   Eyes: Negative for visual disturbance.  Respiratory: Positive for cough. Negative for chest tightness, shortness of breath, wheezing and stridor.   Gastrointestinal: Negative for nausea and vomiting.  Genitourinary: Negative for dysuria and difficulty urinating.  Musculoskeletal: Positive for myalgias. Negative for neck pain and neck stiffness.  Skin: Negative.   Neurological: Negative for dizziness, weakness, numbness and headaches.  Hematological: Negative for adenopathy.  Psychiatric/Behavioral: Negative for confusion.  All other systems reviewed and are negative.     Allergies  Review of patient's allergies indicates no known allergies.  Home Medications   Prior to Admission medications   Medication Sig Start Date End Date Taking? Authorizing Provider  cephALEXin (KEFLEX) 500 MG capsule Take 1 capsule (500 mg total) by mouth 4 (four) times daily. 08/17/15   Kristen N Ward, DO  guaiFENesin-codeine (ROBITUSSIN AC) 100-10 MG/5ML syrup Take 10 mLs by mouth 3 (three) times daily as needed. 09/16/15   Juliette Standre, PA-C  ibuprofen (ADVIL,MOTRIN) 800 MG tablet Take 1 tablet (800 mg total) by mouth 3 (three) times daily. 09/16/15   Laverna Dossett, PA-C  metroNIDAZOLE (FLAGYL) 500 MG tablet Take 1 tablet (500 mg total) by mouth 2 (  two) times daily. 08/17/15   Kristen N Ward, DO  norgestimate-ethinyl estradiol (ORTHO-CYCLEN,SPRINTEC,PREVIFEM) 0.25-35 MG-MCG tablet Take 1 tablet by mouth daily. 07/22/12   Misty StanleyLisa A Leftwich-Kirby, CNM  promethazine (PHENERGAN) 25 MG tablet Take 1 tablet (25 mg total) by mouth every 6 (six) hours as needed for nausea or vomiting. 08/17/15   Kristen N Ward, DO   BP 125/71 mmHg  Pulse 108  Temp(Src) 99 F (37.2 C) (Oral)  Resp 18  Ht 5\' 3"  (1.6 m)  Wt  86.183 kg  BMI 33.67 kg/m2  SpO2 100%  LMP 08/26/2015 Physical Exam  Constitutional: She is oriented to person, place, and time. She appears well-developed and well-nourished. No distress.  HENT:  Head: Normocephalic and atraumatic.  Right Ear: Tympanic membrane and ear canal normal.  Left Ear: Tympanic membrane and ear canal normal.  Nose: Mucosal edema and rhinorrhea present.  Mouth/Throat: Uvula is midline and mucous membranes are normal. No trismus in the jaw. No uvula swelling. Posterior oropharyngeal erythema present. No oropharyngeal exudate, posterior oropharyngeal edema or tonsillar abscesses.  Eyes: Conjunctivae are normal.  Neck: Normal range of motion, full passive range of motion without pain and phonation normal. Neck supple.  Cardiovascular: Normal rate and regular rhythm.   Pulmonary/Chest: Effort normal and breath sounds normal. No respiratory distress. She has no wheezes. She has no rales.  Abdominal: Soft. She exhibits no distension. There is no tenderness.  Musculoskeletal: Normal range of motion. She exhibits no edema.  Lymphadenopathy:    She has no cervical adenopathy.  Neurological: She is alert and oriented to person, place, and time. She exhibits normal muscle tone. Coordination normal.  Skin: Skin is warm and dry.  Psychiatric: She has a normal mood and affect. Thought content normal.  Nursing note and vitals reviewed.   ED Course  Procedures (including critical care time) Labs Review Labs Reviewed - No data to display  Imaging Review No results found. I have personally reviewed and evaluated these images and lab results as part of my medical decision-making.   EKG Interpretation None      MDM   Final diagnoses:  URI (upper respiratory infection)    Patient is well-appearing. Vital signs are stable. Nontoxic. Presenting symptoms are consistent with viral process. She appears stable for discharge and agrees to symptomatic treatment. Close  follow-up with her PCP.    Pauline Ausammy Aryelle Figg, PA-C 09/18/15 1016  Lavera Guiseana Duo Liu, MD 09/18/15 1146

## 2017-07-07 ENCOUNTER — Encounter (HOSPITAL_COMMUNITY): Payer: Self-pay

## 2017-07-07 ENCOUNTER — Other Ambulatory Visit: Payer: Self-pay

## 2017-07-07 ENCOUNTER — Emergency Department (HOSPITAL_COMMUNITY)
Admission: EM | Admit: 2017-07-07 | Discharge: 2017-07-07 | Discharge status: Against Medical Advice | Payer: Medicaid Other | Attending: Emergency Medicine | Admitting: Emergency Medicine

## 2017-07-07 DIAGNOSIS — R51 Headache: Secondary | ICD-10-CM | POA: Diagnosis present

## 2017-07-07 DIAGNOSIS — Z5321 Procedure and treatment not carried out due to patient leaving prior to being seen by health care provider: Secondary | ICD-10-CM | POA: Insufficient documentation

## 2017-07-07 NOTE — ED Notes (Addendum)
Patient stated she was going to take son home and come back. Patient stable at this time. Steady gait noted. Informed patient she can be re seen if she would like. Patient Verbalized understanding.

## 2017-07-07 NOTE — ED Triage Notes (Signed)
Patient reports of headache and cough x3 days. Reports of light sensitivity.

## 2018-09-11 ENCOUNTER — Other Ambulatory Visit: Payer: Self-pay

## 2018-09-11 ENCOUNTER — Encounter (HOSPITAL_COMMUNITY): Payer: Self-pay | Admitting: Emergency Medicine

## 2018-09-11 ENCOUNTER — Emergency Department (HOSPITAL_COMMUNITY)
Admission: EM | Admit: 2018-09-11 | Discharge: 2018-09-11 | Disposition: A | Discharge status: Home / Self Care | Payer: Self-pay | Attending: Emergency Medicine | Admitting: Emergency Medicine

## 2018-09-11 ENCOUNTER — Emergency Department (HOSPITAL_COMMUNITY): Payer: Self-pay

## 2018-09-11 DIAGNOSIS — N730 Acute parametritis and pelvic cellulitis: Secondary | ICD-10-CM | POA: Insufficient documentation

## 2018-09-11 DIAGNOSIS — R102 Pelvic and perineal pain: Secondary | ICD-10-CM | POA: Insufficient documentation

## 2018-09-11 DIAGNOSIS — Z3202 Encounter for pregnancy test, result negative: Secondary | ICD-10-CM | POA: Insufficient documentation

## 2018-09-11 DIAGNOSIS — F17210 Nicotine dependence, cigarettes, uncomplicated: Secondary | ICD-10-CM | POA: Insufficient documentation

## 2018-09-11 DIAGNOSIS — N760 Acute vaginitis: Secondary | ICD-10-CM | POA: Insufficient documentation

## 2018-09-11 DIAGNOSIS — B9689 Other specified bacterial agents as the cause of diseases classified elsewhere: Secondary | ICD-10-CM

## 2018-09-11 LAB — COMPREHENSIVE METABOLIC PANEL
ALK PHOS: 42 U/L (ref 38–126)
ALT: 12 U/L (ref 0–44)
ANION GAP: 10 (ref 5–15)
AST: 13 U/L — ABNORMAL LOW (ref 15–41)
Albumin: 4 g/dL (ref 3.5–5.0)
BILIRUBIN TOTAL: 0.8 mg/dL (ref 0.3–1.2)
BUN: 10 mg/dL (ref 6–20)
CALCIUM: 8.9 mg/dL (ref 8.9–10.3)
CO2: 24 mmol/L (ref 22–32)
Chloride: 101 mmol/L (ref 98–111)
Creatinine, Ser: 0.79 mg/dL (ref 0.44–1.00)
GFR calc Af Amer: >60 mL/min (ref >60)
GLUCOSE: 121 mg/dL — AB (ref 70–99)
Potassium: 3.6 mmol/L (ref 3.5–5.1)
Sodium: 135 mmol/L (ref 135–145)
TOTAL PROTEIN: 7.2 g/dL (ref 6.5–8.1)

## 2018-09-11 LAB — CBC
HCT: 41 % (ref 36.0–46.0)
Hemoglobin: 12.5 g/dL (ref 12.0–15.0)
MCH: 26.2 pg (ref 26.0–34.0)
MCHC: 30.5 g/dL (ref 30.0–36.0)
MCV: 86 fL (ref 80.0–100.0)
PLATELETS: 198 10*3/uL (ref 150–400)
RBC: 4.77 MIL/uL (ref 3.87–5.11)
RDW: 14.1 % (ref 11.5–15.5)
WBC: 13.9 10*3/uL — ABNORMAL HIGH (ref 4.0–10.5)
nRBC: 0 % (ref 0.0–0.2)

## 2018-09-11 LAB — URINALYSIS, ROUTINE W REFLEX MICROSCOPIC
Bilirubin Urine: NEGATIVE
Glucose, UA: NEGATIVE mg/dL
Ketones, ur: 20 mg/dL — AB
Leukocytes,Ua: NEGATIVE
Nitrite: NEGATIVE
PH: 6 (ref 5.0–8.0)
Protein, ur: NEGATIVE mg/dL
Specific Gravity, Urine: 1.009 (ref 1.005–1.030)

## 2018-09-11 LAB — I-STAT BETA HCG BLOOD, ED (MC, WL, AP ONLY): I-stat hCG, quantitative: <5 m[IU]/mL (ref <5)

## 2018-09-11 LAB — WET PREP, GENITAL
SPERM: NONE SEEN
TRICH WET PREP: NONE SEEN
YEAST WET PREP: NONE SEEN

## 2018-09-11 LAB — LIPASE, BLOOD: Lipase: 31 U/L (ref 11–51)

## 2018-09-11 MED ORDER — ACETAMINOPHEN 325 MG PO TABS
650.0000 mg | ORAL_TABLET | Freq: Once | ORAL | Status: AC
  Administered 2018-09-11: 650 mg via ORAL
  Filled 2018-09-11: qty 2

## 2018-09-11 MED ORDER — MORPHINE SULFATE (PF) 4 MG/ML IV SOLN
4.0000 mg | Freq: Once | INTRAVENOUS | Status: AC
  Administered 2018-09-11: 4 mg via INTRAVENOUS
  Filled 2018-09-11: qty 1

## 2018-09-11 MED ORDER — ONDANSETRON 4 MG PO TBDP: 4.0000 mg | ORAL_TABLET | Freq: Once | ORAL | Status: DC | PRN

## 2018-09-11 MED ORDER — DOXYCYCLINE HYCLATE 100 MG PO TABS
100.0000 mg | ORAL_TABLET | Freq: Once | ORAL | Status: AC
  Administered 2018-09-11: 100 mg via ORAL
  Filled 2018-09-11: qty 1

## 2018-09-11 MED ORDER — ONDANSETRON HCL 4 MG/2ML IJ SOLN
4.0000 mg | Freq: Once | INTRAMUSCULAR | Status: AC
  Administered 2018-09-11: 4 mg via INTRAVENOUS
  Filled 2018-09-11: qty 2

## 2018-09-11 MED ORDER — DOXYCYCLINE HYCLATE 100 MG PO CAPS: 100.0000 mg | ORAL_CAPSULE | Freq: Two times a day (BID) | ORAL | 0 refills | Status: AC

## 2018-09-11 MED ORDER — SODIUM CHLORIDE 0.9 % IV SOLN
1.0000 g | Freq: Once | INTRAVENOUS | Status: AC
  Administered 2018-09-11: 1 g via INTRAVENOUS
  Filled 2018-09-11: qty 10

## 2018-09-11 MED ORDER — METRONIDAZOLE 500 MG PO TABS: 500.0000 mg | ORAL_TABLET | Freq: Two times a day (BID) | ORAL | 0 refills | Status: AC

## 2018-09-11 MED ORDER — SODIUM CHLORIDE 0.9% FLUSH
3.0000 mL | Freq: Once | INTRAVENOUS | Status: AC
  Administered 2018-09-11: 3 mL via INTRAVENOUS

## 2018-09-11 MED ORDER — METRONIDAZOLE 500 MG PO TABS
500.0000 mg | ORAL_TABLET | Freq: Once | ORAL | Status: AC
  Administered 2018-09-11: 500 mg via ORAL
  Filled 2018-09-11: qty 1

## 2018-09-11 MED ORDER — SODIUM CHLORIDE 0.9 % IV BOLUS (SEPSIS)
1000.0000 mL | Freq: Once | INTRAVENOUS | Status: AC
  Administered 2018-09-11: 1000 mL via INTRAVENOUS

## 2018-09-11 NOTE — ED Notes (Signed)
Bed: WA05 Expected date:  Expected time:  Means of arrival:  Comments: 

## 2018-09-11 NOTE — ED Notes (Signed)
Bed: NL97 Expected date:  Expected time:  Means of arrival:  Comments: rm 5

## 2018-09-11 NOTE — ED Provider Notes (Signed)
Signout from Dr. Elesa Massed.  33 year old female with lower abdominal pain nausea and subjective fevers.  On exam has cervical discharge and right adnexal tenderness.  Wet prep positive for clue cells. Physical Exam  BP 119/68   Pulse 100   Temp 98.4 F (36.9 C) (Oral)   Resp 17   Ht 5\' 3"  (1.6 m)   Wt 83.9 kg   LMP 09/04/2018   SpO2 97%   BMI 32.77 kg/m   Physical Exam  ED Course/Procedures   Clinical Course as of Sep 11 1811  Wed Sep 11, 2018  0831 Patient's ultrasound does show a right adnexal mass that could be a hemorrhagic cyst with some also free fluid.  They recommend follow-up ultrasound to make sure it is resolved.   [MB]    Clinical Course User Index [MB] Terrilee Files, MD    Procedures  MDM  Patient is pending a pelvic ultrasound.  If she has ovarian cyst or other explanation for her pain can discharge on Flagyl and doxy.  If ultrasound unremarkable consider proceeding with CAT scan of abdomen pelvis to evaluate for appendix or other cause.       Terrilee Files, MD 09/11/18 [phone]

## 2018-09-11 NOTE — ED Notes (Signed)
US at bedside

## 2018-09-11 NOTE — ED Triage Notes (Addendum)
Patient complaining of nausea and flank pain radiating to right upper abdominal area. Patient states these symptoms started yesterday. Patient states she is spotting.

## 2018-09-11 NOTE — ED Provider Notes (Signed)
TIME SEEN: 5:36 AM  CHIEF COMPLAINT: Abdominal pain, nausea, vaginal discharge  HPI: Patient is a 33 year old female with history of bipolar disorder, ovarian cyst, previous cholecystectomy who presents to the emergency department with generalized abdominal pain, nausea.  States that she has noticed blood whenever she wipes and has had abnormal vaginal discharge.  States that some of her lower abdominal pain feels like her previous ovarian cyst but she complains of pain throughout the abdomen that started today.  No fever but feels warm to touch.  No diarrhea.  No dysuria.  ROS: See HPI Constitutional: no fever  Eyes: no drainage  ENT: no runny nose   Cardiovascular:  no chest pain  Resp: no SOB  GI: no vomiting GU: no dysuria Integumentary: no rash  Allergy: no hives  Musculoskeletal: no leg swelling  Neurological: no slurred speech ROS otherwise negative  PAST MEDICAL HISTORY/PAST SURGICAL HISTORY:  Past Medical History:  Diagnosis Date  . Abnormal Pap smear   . Bipolar 1 disorder (HCC)   . Depression   . Headache(784.0)     MEDICATIONS:  Prior to Admission medications   Medication Sig Start Date End Date Taking? Authorizing Provider  acetaminophen (TYLENOL) 325 MG tablet Take 650 mg by mouth every 6 (six) hours as needed for moderate pain.   Yes [provider]  ibuprofen (ADVIL,MOTRIN) 200 MG tablet Take 400 mg by mouth every 6 (six) hours as needed for moderate pain.   Yes [provider]  cephALEXin (KEFLEX) 500 MG capsule Take 1 capsule (500 mg total) by mouth 4 (four) times daily. Patient not taking: Reported on 09/11/2018 08/17/15   , Layla Maw, DO  guaiFENesin-codeine (ROBITUSSIN AC) 100-10 MG/5ML syrup Take 10 mLs by mouth 3 (three) times daily as needed. Patient not taking: Reported on 09/11/2018 09/16/15   Triplett, Babette Relic, PA-C  ibuprofen (ADVIL,MOTRIN) 800 MG tablet Take 1 tablet (800 mg total) by mouth 3 (three) times daily. Patient not taking:  Reported on 09/11/2018 09/16/15   Triplett, Tammy, PA-C  metroNIDAZOLE (FLAGYL) 500 MG tablet Take 1 tablet (500 mg total) by mouth 2 (two) times daily. Patient not taking: Reported on 09/11/2018 08/17/15   , Layla Maw, DO  norgestimate-ethinyl estradiol (ORTHO-CYCLEN,SPRINTEC,PREVIFEM) 0.25-35 MG-MCG tablet Take 1 tablet by mouth daily. Patient not taking: Reported on 09/11/2018 07/22/12   Leftwich-Kirby, Wilmer Floor, CNM  promethazine (PHENERGAN) 25 MG tablet Take 1 tablet (25 mg total) by mouth every 6 (six) hours as needed for nausea or vomiting. Patient not taking: Reported on 09/11/2018 08/17/15   , Layla Maw, DO    ALLERGIES:  No Known Allergies  SOCIAL HISTORY:  Social History   Tobacco Use  . Smoking status: Current Every Day Smoker    Packs/day: 1.00    Years: 10.00    Pack years: 10.00    Types: Cigarettes  . Smokeless tobacco: Never Used  Substance Use Topics  . Alcohol use: No    FAMILY HISTORY: Family History  Problem Relation Age of Onset  . Heart disease Father   . COPD Father   . Hypothyroidism Mother   . Hypothyroidism Maternal Aunt   . Hypothyroidism Maternal Uncle   . Hypothyroidism Paternal Uncle   . Hypothyroidism Maternal Grandmother   . Breast cancer Maternal Grandmother   . Heart disease Maternal Grandfather     EXAM: BP 105/65 (BP Location: Right Arm)   Pulse (!) 111   Temp 98.4 F (36.9 C) (Oral)   Resp 18   Ht  5\' 3"  (1.6 m)   Wt 83.9 kg   LMP 09/04/2018   SpO2 98%   BMI 32.77 kg/m  CONSTITUTIONAL: Alert and oriented and responds appropriately to questions. Well-appearing; well-nourished HEAD: Normocephalic EYES: Conjunctivae clear, pupils appear equal, EOMI ENT: normal nose; moist mucous membranes NECK: Supple, no meningismus, no nuchal rigidity, no LAD  CARD: RRR; S1 and S2 appreciated; no murmurs, no clicks, no rubs, no gallops RESP: Normal chest excursion without splinting or tachypnea; breath sounds clear and equal bilaterally; no  wheezes, no rhonchi, no rales, no hypoxia or respiratory distress, speaking full sentences ABD/GI: Normal bowel sounds; non-distended; soft, diffusely tender throughout the abdomen with voluntary guarding, no rebound GU:  Normal external genitalia. No lesions, rashes noted. Patient has no vaginal bleeding on exam.  Significant amount of purulent foul-smelling vaginal discharge from the cervical os.  She has right adnexal tenderness without appreciable mass.  She has cervical motion tenderness.  No left adnexal tenderness.  Cervix is not appear friable.  Cervix is closed.  Chaperone present for exam. BACK:  The back appears normal and is non-tender to palpation, there is no CVA tenderness EXT: Normal ROM in all joints; non-tender to palpation; no edema; normal capillary refill; no cyanosis, no calf tenderness or swelling    SKIN: Normal color for age and race; warm; no rash NEURO: Moves all extremities equally PSYCH: The patient's mood and manner are appropriate. Grooming and personal hygiene are appropriate.  MEDICAL DECISION MAKING: Patient here with complaints of diffuse abdominal pain, nausea, vaginal discharge and bleeding.  Last menstrual cycle was March 4.  She is sexually active.  She has had previous cholecystectomy and D&C.  Differential includes viral gastroenteritis, colitis, diverticulitis, appendicitis, PID, ovarian cyst, UTI, bowel obstruction.  Will obtain labs, urine.  Will perform pelvic exam and obtain CT of the abdomen pelvis.  Will give pain and nausea medicine as well as IV fluids.  ED PROGRESS: Now that patient's pain is better controlled with morphine, she now states all of her pain originates from the right pelvic region.  She reports she has had a foul-smelling vaginal discharge that started first and then the pain started after this.  She is sexually active with one female partner who is at the bedside.  She previously has had trichomonas which has been treated.  She has had 4  previous pregnancy with 2 children and 2 previous miscarriages.  Her pelvic exam is concerning for significant amount of foul-smelling purulent discharge coming from the cervical loss.  She has right adnexal tenderness without mass as well as cervical motion tenderness.  Will treat for PID.  Will hold on the CT scan and obtain a transvaginal ultrasound with Doppler first to evaluate for TOA, torsion, cyst.  Pregnancy test pending.   8:00 AM  Pt's blood work shows leukocytosis of 13,000.  Pregnancy test negative.  Catheterized urine specimen shows no sign of infection.  Wet prep positive for clue cells.  Will treat with Flagyl.  Transvaginal ultrasound pending.  If no cause of patient's right adnexal pain seen on ultrasound, recommend obtaining CT of abdomen pelvis as she does have some tenderness at McBurney's point.  Signed out to oncoming ED physician for follow-up.   I reviewed all nursing notes, vitals, pertinent previous records, EKGs, lab and urine results, imaging (as available).     , Layla Maw, DO 09/11/18 [phone]

## 2018-09-11 NOTE — Discharge Instructions (Addendum)
You were seen in the emergency department for some low abdominal pain.  Your ultrasound showed a right pelvic mass that is likely a hemorrhagic ovarian cyst.  This will need to be followed up with gynecology and we are giving you some options below.  You will need a follow-up ultrasound in 6 weeks to document that the mass is improved.  We are also treating you for possible infection with antibiotics.  You will need to finish all the antibiotics.  Use Tylenol and ibuprofen for pain.  Please return if any worsening symptoms.  Center for [facility] at [facility] [address] [place], [place] [phone]  [facility] [address] [place]  [address] [place], [place] [phone]   [facility] [address] [place] [address] [place], [place] [phone]  [facility] [address] [place], [place] [phone]   [facility] [address] [place] [address] [place], [place] [phone]   [facility] [address] [place], [place] [phone]   [facility] [address] [place], [place] [phone]   [facility] [address] [place], [place] [phone]

## 2018-09-12 LAB — GC/CHLAMYDIA PROBE AMP (~~LOC~~) NOT AT ARMC
CHLAMYDIA, DNA PROBE: NEGATIVE
Neisseria Gonorrhea: POSITIVE — AB

## 2020-01-28 ENCOUNTER — Other Ambulatory Visit: Payer: Self-pay | Admitting: Family Medicine

## 2020-01-28 DIAGNOSIS — N6452 Nipple discharge: Secondary | ICD-10-CM

## 2020-01-28 DIAGNOSIS — N611 Abscess of the breast and nipple: Secondary | ICD-10-CM

## 2020-01-29 ENCOUNTER — Ambulatory Visit
Admission: RE | Admit: 2020-01-29 | Discharge: 2020-01-29 | Disposition: A | Discharge status: Home / Self Care | Payer: Medicaid Other | Source: Ambulatory Visit | Attending: Obstetrics and Gynecology | Admitting: Obstetrics and Gynecology

## 2020-01-29 ENCOUNTER — Other Ambulatory Visit: Payer: Self-pay | Admitting: Obstetrics and Gynecology

## 2020-01-29 ENCOUNTER — Other Ambulatory Visit: Payer: Self-pay

## 2020-01-29 ENCOUNTER — Other Ambulatory Visit: Payer: Self-pay | Admitting: Family Medicine

## 2020-01-29 ENCOUNTER — Other Ambulatory Visit (HOSPITAL_COMMUNITY)
Admission: RE | Admit: 2020-01-29 | Discharge: 2020-01-29 | Disposition: A | Discharge status: Home / Self Care | Payer: No Typology Code available for payment source | Source: Ambulatory Visit | Attending: Obstetrics and Gynecology | Admitting: Obstetrics and Gynecology

## 2020-01-29 ENCOUNTER — Ambulatory Visit: Payer: Self-pay | Admitting: *Deleted

## 2020-01-29 VITALS — BP 128/82 | Temp 97.8°F | Wt 182.7 lb

## 2020-01-29 DIAGNOSIS — N6452 Nipple discharge: Secondary | ICD-10-CM

## 2020-01-29 DIAGNOSIS — N6310 Unspecified lump in the right breast, unspecified quadrant: Secondary | ICD-10-CM

## 2020-01-29 DIAGNOSIS — N611 Abscess of the breast and nipple: Secondary | ICD-10-CM

## 2020-01-29 DIAGNOSIS — Z1239 Encounter for other screening for malignant neoplasm of breast: Secondary | ICD-10-CM

## 2020-01-29 NOTE — Progress Notes (Addendum)
[name] is a 34 y.o. female who presents to [facility] clinic today with complaint of right breast abscess. Patient seen at [facility] in [place] on 01/28/2020 and antibiotic doxycline given.    Pap Smear: Pap not smear completed today. Patient is unsure when she had her last Pap smear and was normal per patient. Per patient has history of an abnormal Pap smear around 9 years ago that a colposcopy was completed for follow-up that was benign. Last Pap smear result is not available in Epic.   Physical exam: Breasts Right breast swollen and slightly larger than left breast that started around 3 days ago. No skin abnormalities left breast. Right breast reddened between 3 o'clock and 7 o'clock beginning around the nipple to around 2 cm from the nipple. A rash observed right breast between 4 o'clock and 6 o'clock 3 cm from the nipple. No nipple retraction bilateral breasts. No nipple discharge left breast. A bloody pus like draining expressed from right nipple on exam. Sample of discharge sent to Cytology for evaluation. No lymphadenopathy. No lumps palpated left breast. Palpated a lump right lower quadrant of breast between 3 o'clock and 7 o'clock. Patient complained of pain when palpated right breast lump on exam.    Pelvic/Bimanual Pap indicated per BCCCP guidelines. 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 Patient not eligible to have Pap smear completed in BCCCP. Patient advised can either have completed at one of the free cervical cancer screenings, the [facility] Department, or her provider.   Smoking History: Patient has is a current smoker. Discussed smoking cessation with patient. Referred to the [facility] Quitline and gave resources to the free smoking cessation classes at [facility].   Patient Navigation: Patient education provided. Access to services provided for patient through BCCCP program.    Breast and Cervical Cancer Risk  Assessment: Patient has family history of her paternal grandmother having breast cancer. Patient has no known genetic mutations or history of radiation treatment to the chest before age 85. Per patient does not have history of cervical dysplasia, immunocompromised, or DES exposure in-utero. Breast cancer risk assessment completed. No breast cancer risk calculated due to patient is less than 80 years old.  A: BCCCP exam without pap smear Complaint of right breast abscess.  P: Referred patient to the [facility] for a diagnostic mammogram and right breast ultrasound. Appointment scheduled Thursday, January 29, 2020 at 1100.  Priscille Heidelberg, RN 01/29/2020 10:59 AM

## 2020-01-29 NOTE — Patient Instructions (Signed)
Explained breast self awareness with [name]. Patient is due for a Pap smear. 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 Told patient she can schedule a Pap smear at one of our free cervical cancer screenings, [facility] Department, or with her provider. Let her know BCCCP will cover Pap smears every 3 years unless has a history of abnormal Pap smears. Referred patient to the [facility] for a diagnostic mammogram and right breast ultrasound. Appointment scheduled Thursday, January 29, 2020 at 1100. Patient aware of appointment and will be there. Discussed smoking cessation with patient. Referred to the [facility] Quitline and gave resources to the free smoking cessation classes at [facility]. [name] verbalized understanding.  Attikus Bartoszek, Kathaleen Maser, RN 10:59 AM

## 2020-01-30 ENCOUNTER — Telehealth: Payer: Self-pay

## 2020-01-30 LAB — CYTOLOGY - NON PAP

## 2020-01-30 NOTE — Telephone Encounter (Signed)
Patient informed lab results-culture(breast discharge) positive for bacteria, negative for malignancy, per Dr. Jolayne Panther, continue course of Doxycycline. Patient verbalized understanding.

## 2020-01-31 LAB — AEROBIC/ANAEROBIC CULTURE W GRAM STAIN (SURGICAL/DEEP WOUND)

## 2020-02-06 ENCOUNTER — Other Ambulatory Visit: Payer: Self-pay | Admitting: Obstetrics and Gynecology

## 2020-02-06 ENCOUNTER — Ambulatory Visit
Admission: RE | Admit: 2020-02-06 | Discharge: 2020-02-06 | Disposition: A | Discharge status: Home / Self Care | Payer: No Typology Code available for payment source | Source: Ambulatory Visit | Attending: Obstetrics and Gynecology | Admitting: Obstetrics and Gynecology

## 2020-02-06 ENCOUNTER — Other Ambulatory Visit: Payer: Self-pay

## 2020-02-06 DIAGNOSIS — N611 Abscess of the breast and nipple: Secondary | ICD-10-CM

## 2020-02-06 DIAGNOSIS — N6452 Nipple discharge: Secondary | ICD-10-CM

## 2020-02-27 ENCOUNTER — Ambulatory Visit
Admission: RE | Admit: 2020-02-27 | Discharge: 2020-02-27 | Disposition: A | Discharge status: Home / Self Care | Payer: No Typology Code available for payment source | Source: Ambulatory Visit | Attending: Obstetrics and Gynecology | Admitting: Obstetrics and Gynecology

## 2020-02-27 ENCOUNTER — Other Ambulatory Visit: Payer: Self-pay

## 2020-02-27 DIAGNOSIS — N611 Abscess of the breast and nipple: Secondary | ICD-10-CM

## 2021-10-24 IMAGING — US US BREAST*R* LIMITED INC AXILLA
1 series · 13 of 19 positions shown · non-contrast
Comparison: None.

CLINICAL DATA: 34-year-old female with RIGHT breast pain, redness
and swelling. Patient is currently on day 2 of antibiotic therapy.

EXAM:
DIGITAL DIAGNOSTIC BILATERAL MAMMOGRAM WITH CAD AND TOMO
ULTRASOUND RIGHT BREAST

[Series 1: us breast*right* limited inc axilla · 0.06mm/px · 13 of 19 slices shown]
[im 1/19]
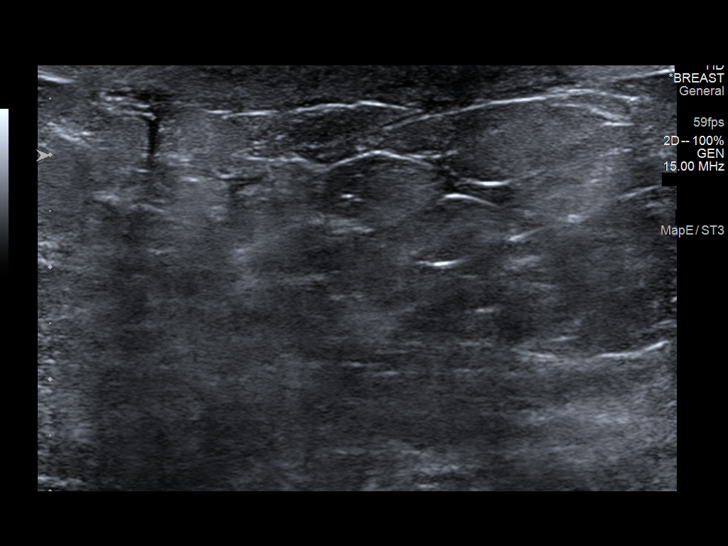
[im 3/19]
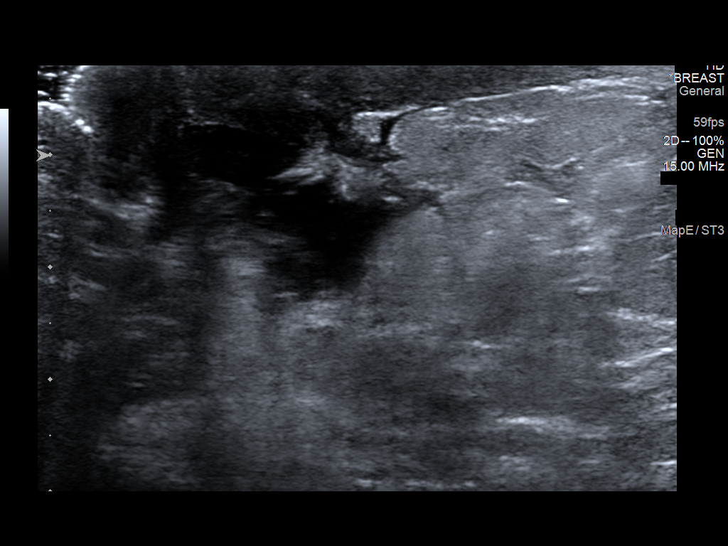
[im 4/19]
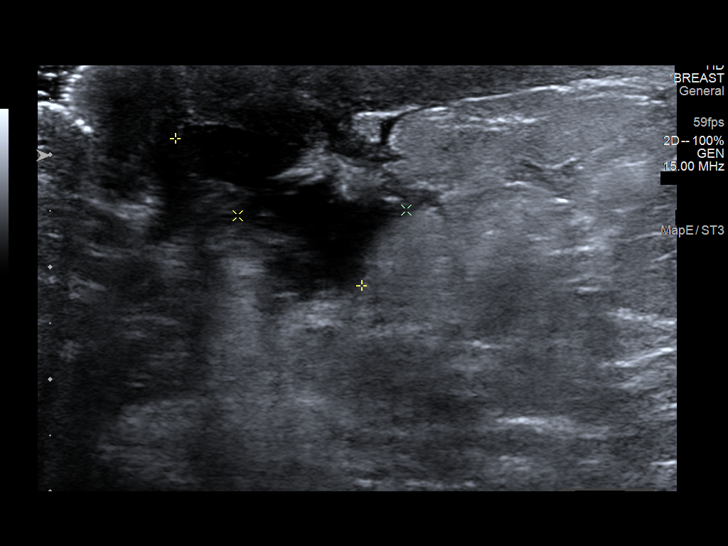
[im 6/19]
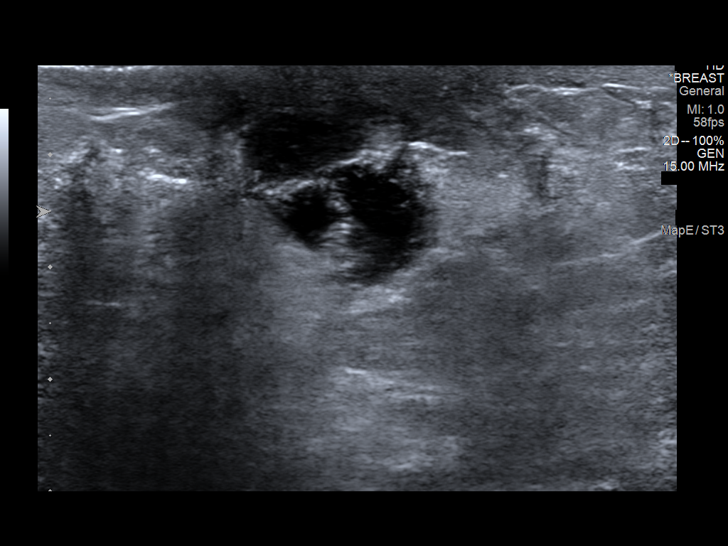
[im 7/19]
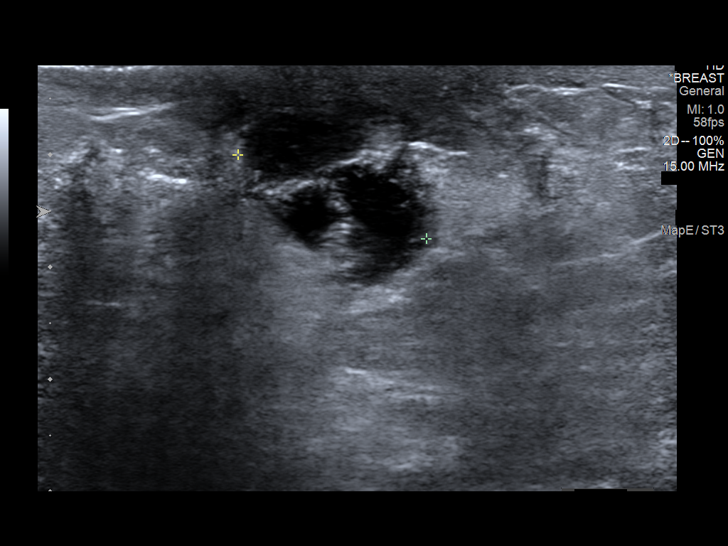
[im 9/19]
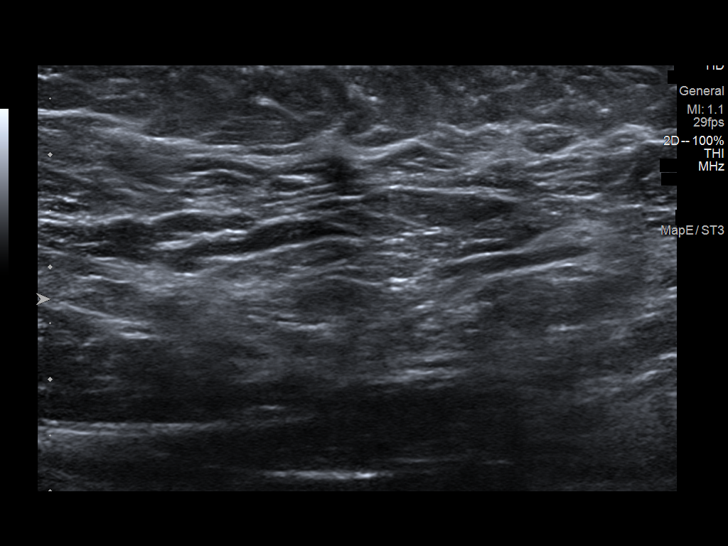
[im 10/19]
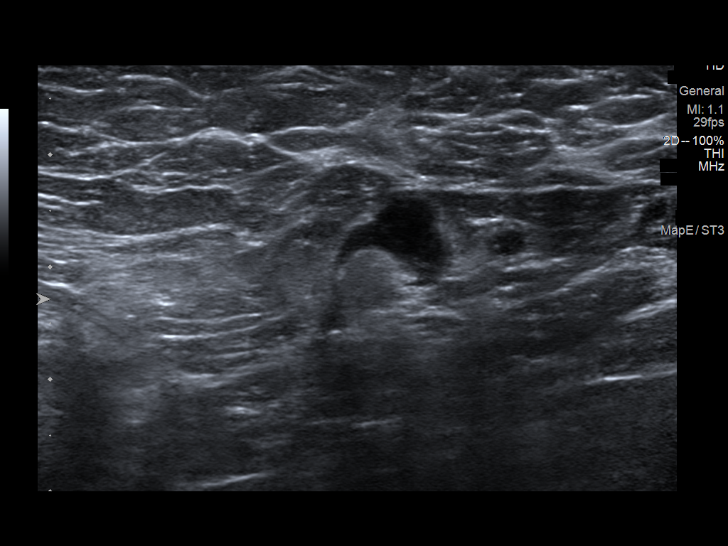
[im 11/19]
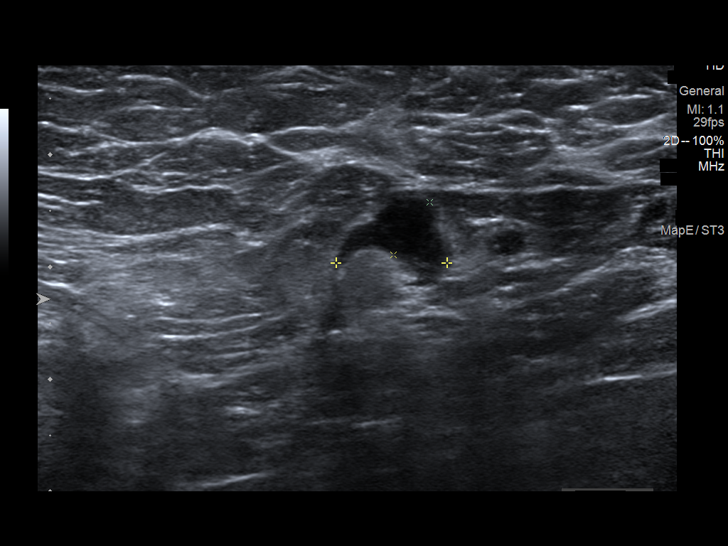
[im 13/19]
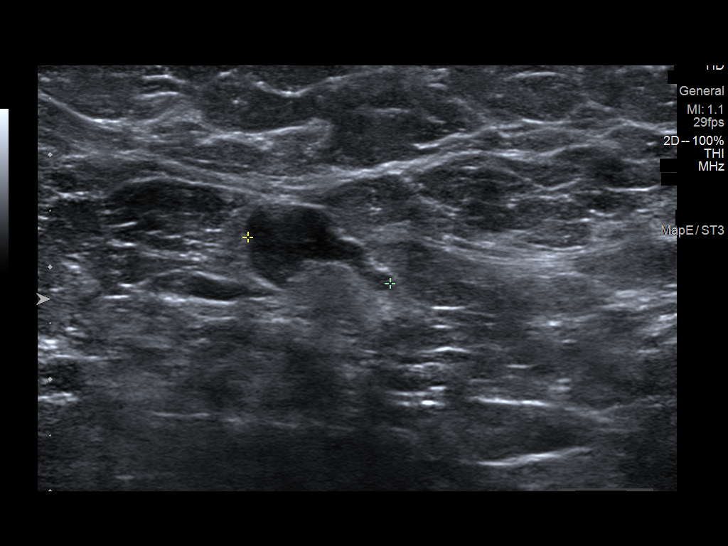
[im 14/19]
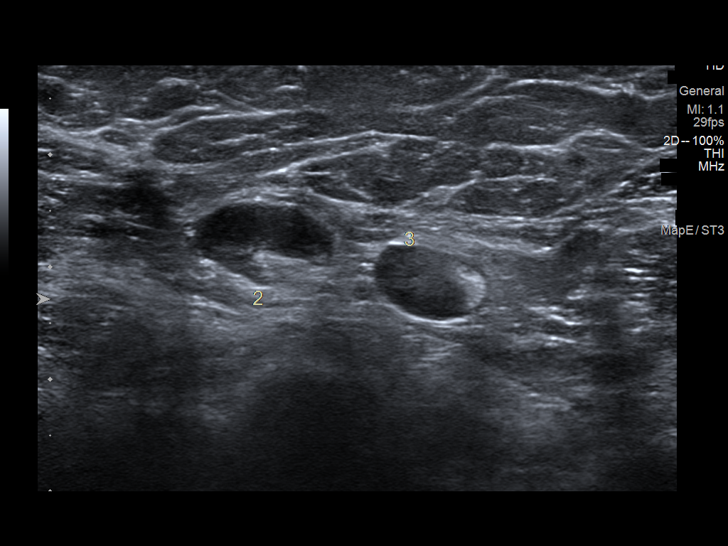
[im 16/19]
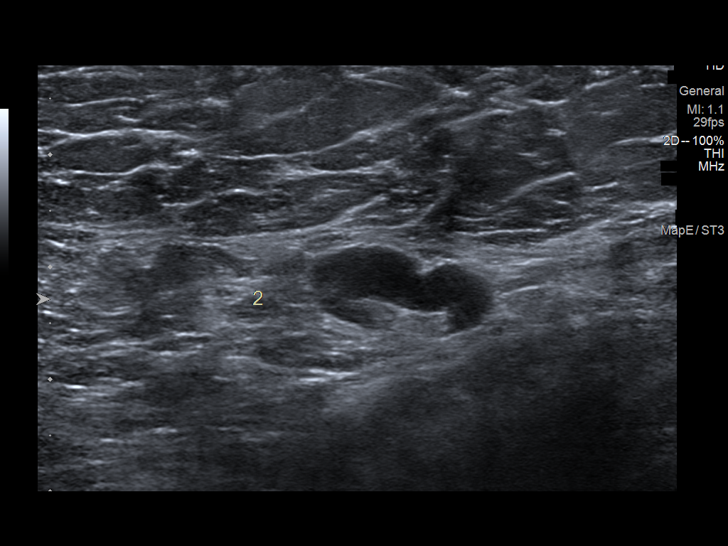
[im 17/19]
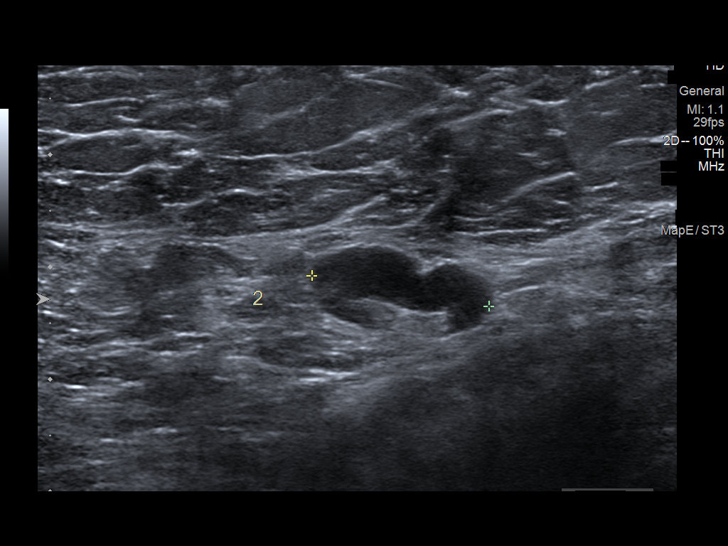
[im 19/19]
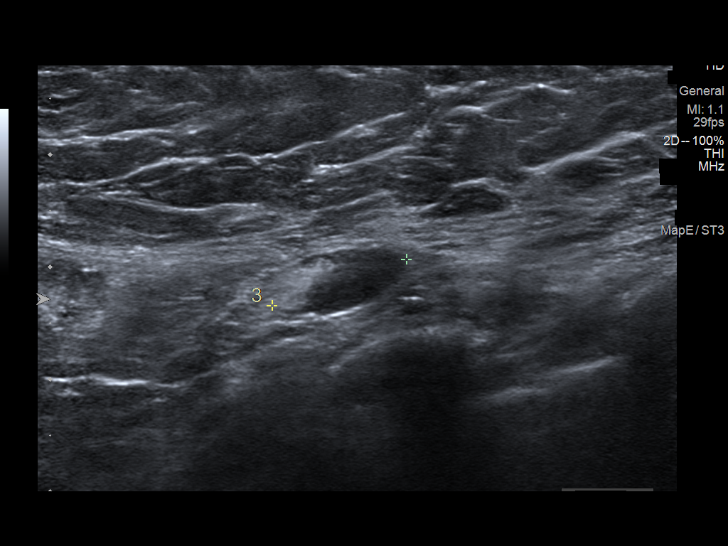

[13 of 19 positions shown; findings below may reference images not displayed]

ACR Breast Density Category b: There are scattered areas of
fibroglandular density.
FINDINGS: 2D/3D full field views of both breasts demonstrate increased density
within the RETROAREOLAR RIGHT breast.

No discrete mass, distortion or worrisome calcifications are noted
within either breast.

Mammographic images were processed with CAD.

On physical exam, erythema within the periareolar RIGHT breast
identified.

Targeted ultrasound is performed, showing a 2.1 x 1.5 x 1.8 cm
collection/abscess at the 4 o'clock position of the RETROAREOLAR
RIGHT breast, 1 cm from the nipple.

Three prominent RIGHT axillary lymph nodes are identified, the
largest with cortical thickening of 7 mm.
IMPRESSION: 1. 2.1 cm RETROAREOLAR RIGHT breast abscess. Aspiration is
recommended.
2. No findings suspicious for breast malignancy.
3. Prominent RIGHT axillary lymph nodes, likely reactive.

RECOMMENDATION:
Ultrasound-guided RIGHT breast aspiration, which will be performed
today but dictated in a separate report.

I have discussed the findings and recommendations with the patient.
If applicable, a reminder letter will be sent to the patient
regarding the next appointment.

BI-RADS CATEGORY  2: Benign.

## 2021-11-01 IMAGING — US US BREAST*R* LIMITED INC AXILLA
1 series · 4 of 4 positions shown · non-contrast
Comparison: Previous exam(s).

CLINICAL DATA: 34-year-old female presenting for follow-up of a
right breast abscess. The patient has continued on antibiotics and
has 2 days left. The lump and the pain in the right breast have
significantly improved.

EXAM:
ULTRASOUND OF THE RIGHT BREAST

[Series 1: us breast*right* limited inc axilla · 0.06mm/px · 4 of 4 slices shown]
[im 1/4]
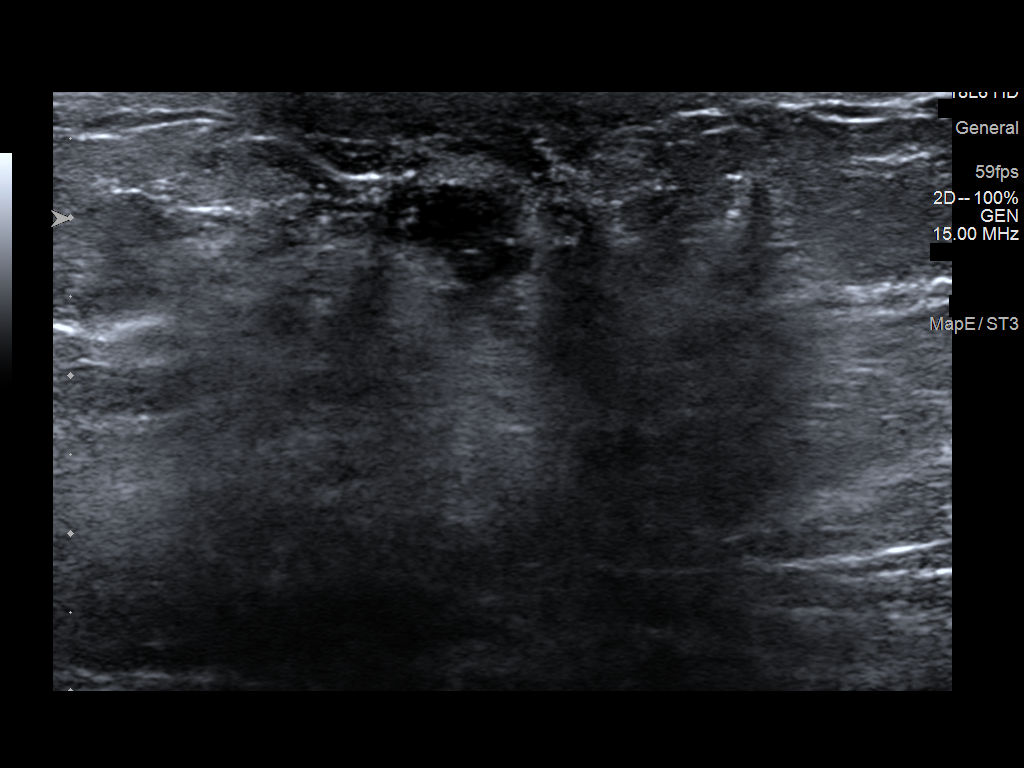
[im 2/4]
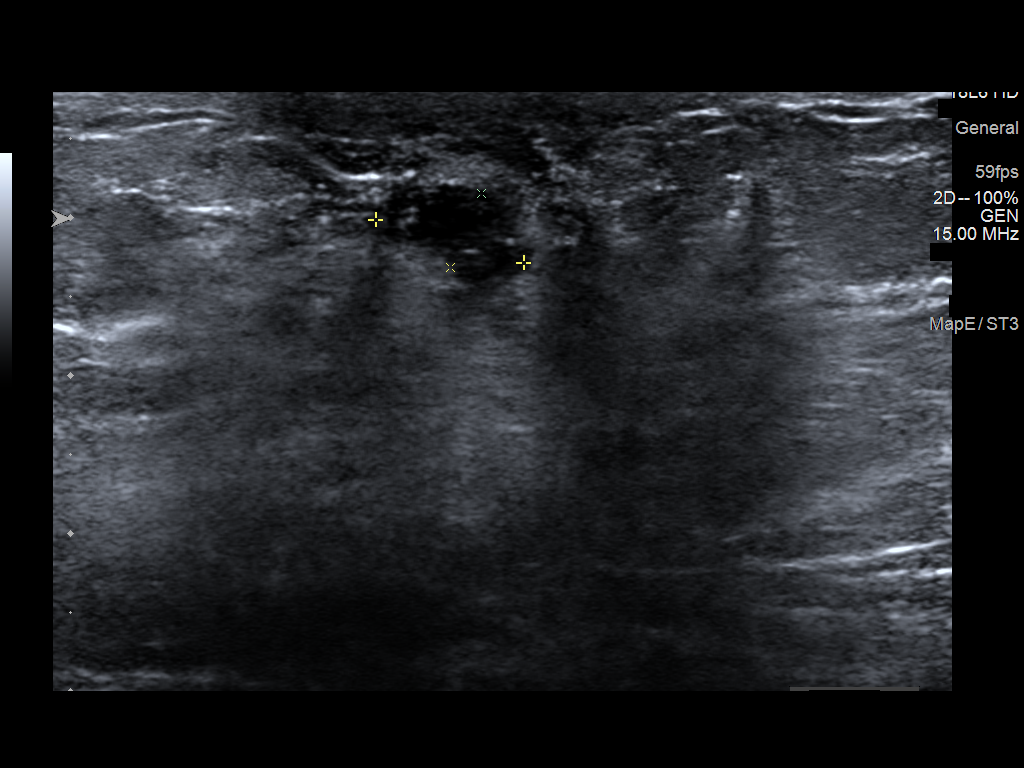
[im 3/4]
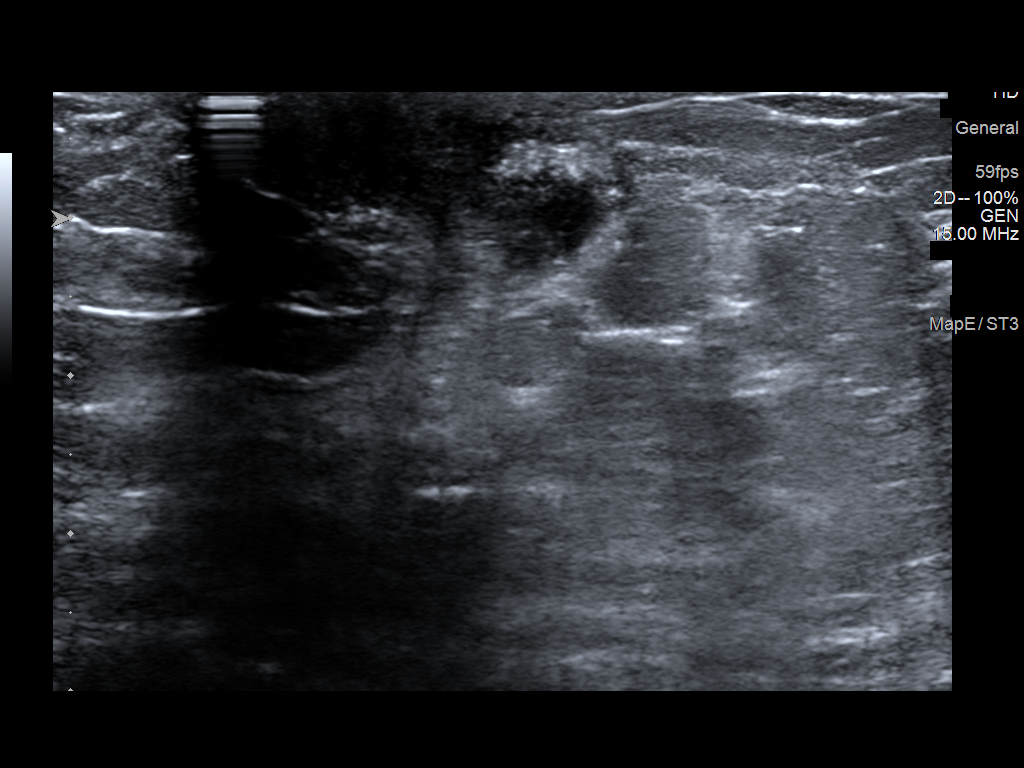
[im 4/4]
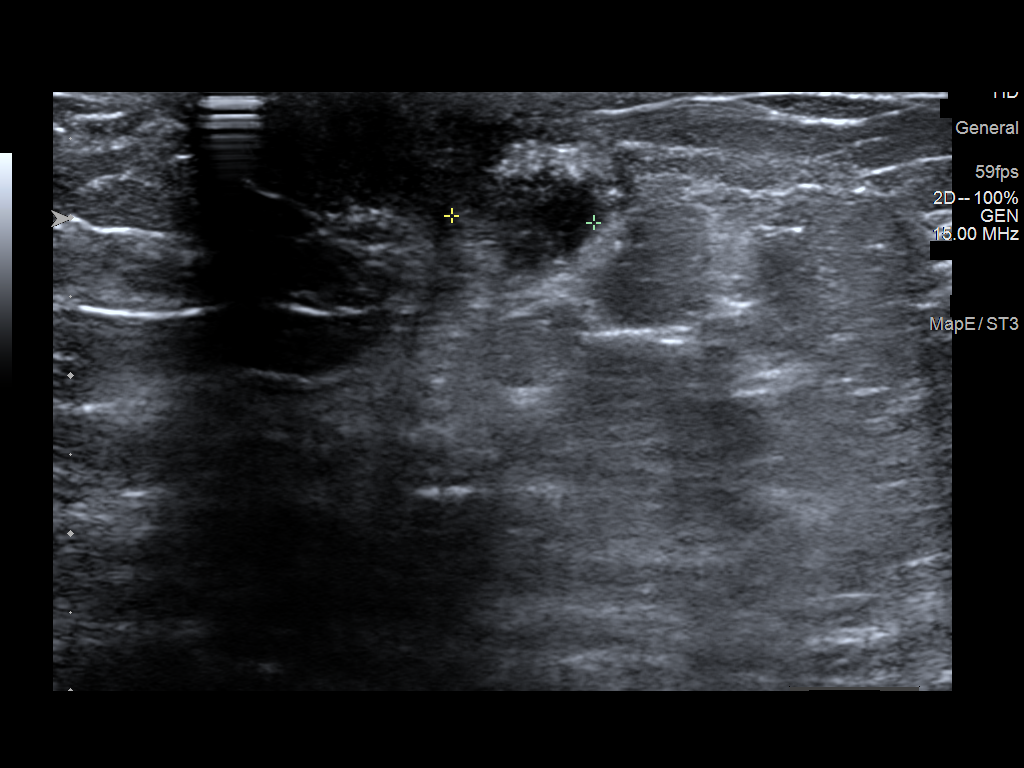

[4 of 4 positions shown; findings below may reference images not displayed]

FINDINGS: Ultrasound targeted to the right breast at 4 o'clock, 1 cm from the
nipple demonstrates a small residual collection measuring 1.0 x
x 0.9 cm. Previously, this measured 2.1 x 1.5 x 1.8 cm on
01/29/2020.
IMPRESSION: Significant decrease in size with a small residual collection in the
right breast at 4 o'clock, too small to warrant repeat aspiration.

RECOMMENDATION:
The patient was instructed to continue her antibiotics until the
course is complete. We would like to see her back for an ultrasound
in 3 weeks as long as she is feeling well. If any of her symptoms
recur following completion of antibiotics she should call to be seen
sooner than the 3 week follow-up.

I have discussed the findings and recommendations with the patient.
If applicable, a reminder letter will be sent to the patient
regarding the next appointment.

BI-RADS CATEGORY  3: Probably benign.

## 2021-11-22 IMAGING — US US BREAST*R* LIMITED INC AXILLA
1 series · 2 of 2 positions shown · non-contrast
Comparison: Previous exam(s).

CLINICAL DATA: Interval follow-up of a an abscess in the LOWER
OUTER subareolar RIGHT breast after antibiotic therapy. The abscess
was aspirated on 01/29/2020. The patient states that she is
currently asymptomatic.

EXAM:
ULTRASOUND OF THE RIGHT BREAST

[Series 1: us breast*right* limited inc axilla · 0.06mm/px · 2 of 2 slices shown]
[im 1/2]
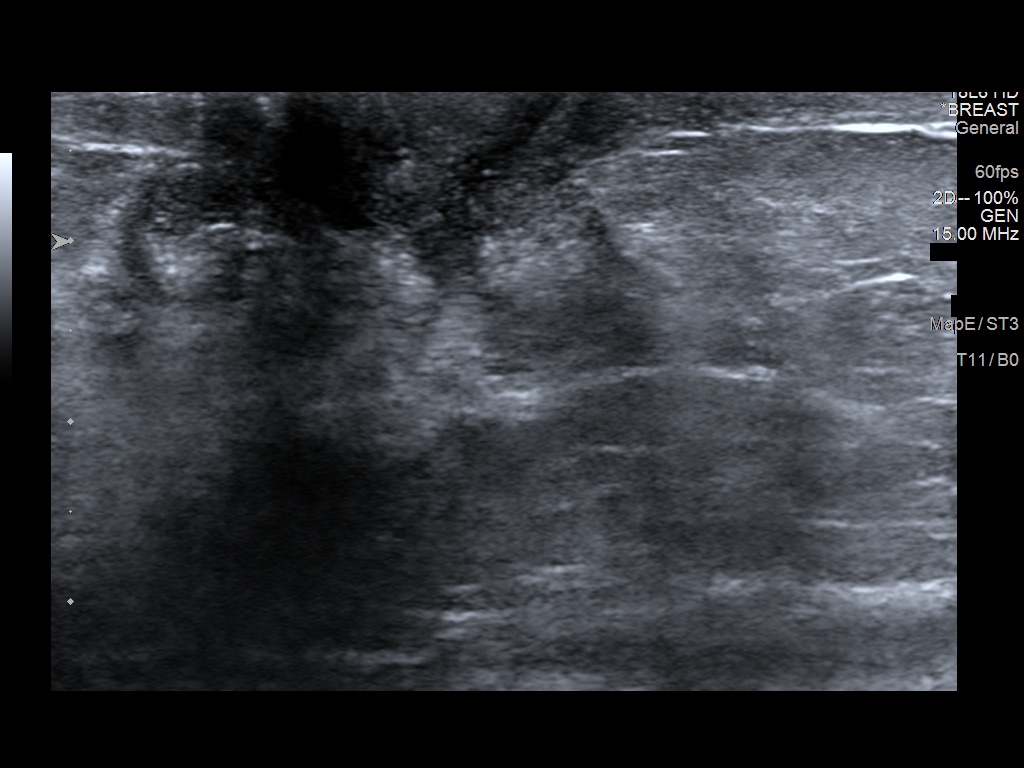
[im 2/2]
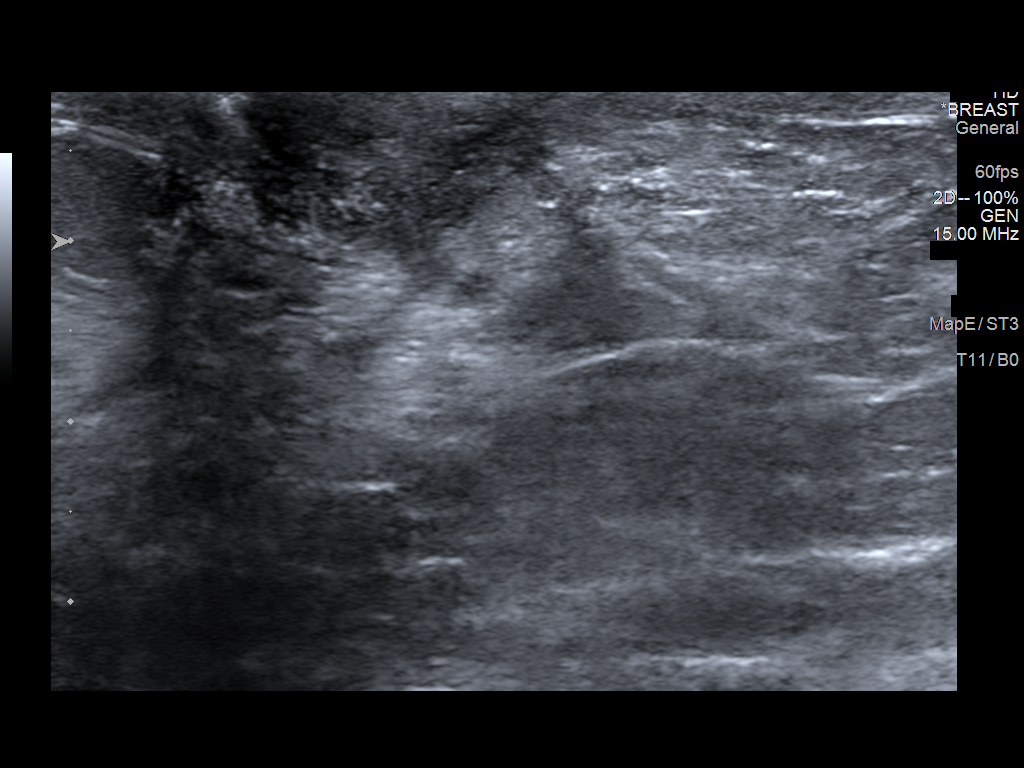

[2 of 2 positions shown; findings below may reference images not displayed]

FINDINGS: The 1 cm fluid collection at the 4 o'clock position approximately 1
cm from nipple identified on the 02/06/2020 examination has resolved
in the interval. There is no residual abscess.
IMPRESSION: Resolution of abscess in the LOWER OUTER subareolar RIGHT breast.

RECOMMENDATION:
1. No further imaging follow-up is felt necessary unless there are
subsequent clinical concerns.
2. Screening mammogram at age 41 unless there are persistent or
intervening clinical concerns. (Code:[id number])

I have discussed the findings and recommendations with the patient.
If applicable, a reminder letter will be sent to the patient
regarding the next appointment.

BI-RADS CATEGORY  1: Negative.

## 2022-11-01 DIAGNOSIS — Z419 Encounter for procedure for purposes other than remedying health state, unspecified: Secondary | ICD-10-CM | POA: Diagnosis not present

## 2022-12-02 DIAGNOSIS — Z419 Encounter for procedure for purposes other than remedying health state, unspecified: Secondary | ICD-10-CM | POA: Diagnosis not present

## 2023-01-01 DIAGNOSIS — Z419 Encounter for procedure for purposes other than remedying health state, unspecified: Secondary | ICD-10-CM | POA: Diagnosis not present

## 2023-02-01 DIAGNOSIS — Z419 Encounter for procedure for purposes other than remedying health state, unspecified: Secondary | ICD-10-CM | POA: Diagnosis not present

## 2023-03-04 DIAGNOSIS — Z419 Encounter for procedure for purposes other than remedying health state, unspecified: Secondary | ICD-10-CM | POA: Diagnosis not present

## 2023-04-03 DIAGNOSIS — Z419 Encounter for procedure for purposes other than remedying health state, unspecified: Secondary | ICD-10-CM | POA: Diagnosis not present

## 2023-05-04 DIAGNOSIS — Z419 Encounter for procedure for purposes other than remedying health state, unspecified: Secondary | ICD-10-CM | POA: Diagnosis not present

## 2023-06-03 DIAGNOSIS — Z419 Encounter for procedure for purposes other than remedying health state, unspecified: Secondary | ICD-10-CM | POA: Diagnosis not present

## 2023-07-04 DIAGNOSIS — Z419 Encounter for procedure for purposes other than remedying health state, unspecified: Secondary | ICD-10-CM | POA: Diagnosis not present

## 2023-08-04 DIAGNOSIS — Z419 Encounter for procedure for purposes other than remedying health state, unspecified: Secondary | ICD-10-CM | POA: Diagnosis not present

## 2023-09-01 DIAGNOSIS — Z419 Encounter for procedure for purposes other than remedying health state, unspecified: Secondary | ICD-10-CM | POA: Diagnosis not present

## 2023-10-13 DIAGNOSIS — Z419 Encounter for procedure for purposes other than remedying health state, unspecified: Secondary | ICD-10-CM | POA: Diagnosis not present

## 2023-11-12 DIAGNOSIS — Z419 Encounter for procedure for purposes other than remedying health state, unspecified: Secondary | ICD-10-CM | POA: Diagnosis not present

## 2023-12-13 DIAGNOSIS — Z419 Encounter for procedure for purposes other than remedying health state, unspecified: Secondary | ICD-10-CM | POA: Diagnosis not present

## 2024-01-12 DIAGNOSIS — Z419 Encounter for procedure for purposes other than remedying health state, unspecified: Secondary | ICD-10-CM | POA: Diagnosis not present

## 2024-02-12 DIAGNOSIS — Z419 Encounter for procedure for purposes other than remedying health state, unspecified: Secondary | ICD-10-CM | POA: Diagnosis not present

## 2024-03-14 DIAGNOSIS — Z419 Encounter for procedure for purposes other than remedying health state, unspecified: Secondary | ICD-10-CM | POA: Diagnosis not present
